# Patient Record
Sex: Female | Born: 1950 | Race: White | Hispanic: No | Marital: Married | Smoking: Never smoker
Health system: Southern US, Community
[De-identification: ages and names within clinical notes are randomized; demographics above are authoritative.]

## PROBLEM LIST (undated history)

## (undated) DIAGNOSIS — Z9889 Other specified postprocedural states: Secondary | ICD-10-CM

## (undated) DIAGNOSIS — I700 Atherosclerosis of aorta: Secondary | ICD-10-CM

## (undated) DIAGNOSIS — K219 Gastro-esophageal reflux disease without esophagitis: Secondary | ICD-10-CM

## (undated) DIAGNOSIS — I4891 Unspecified atrial fibrillation: Secondary | ICD-10-CM

## (undated) DIAGNOSIS — I10 Essential (primary) hypertension: Secondary | ICD-10-CM

## (undated) DIAGNOSIS — G894 Chronic pain syndrome: Secondary | ICD-10-CM

## (undated) DIAGNOSIS — I509 Heart failure, unspecified: Secondary | ICD-10-CM

## (undated) DIAGNOSIS — E119 Type 2 diabetes mellitus without complications: Secondary | ICD-10-CM

## (undated) DIAGNOSIS — C4492 Squamous cell carcinoma of skin, unspecified: Secondary | ICD-10-CM

## (undated) DIAGNOSIS — E039 Hypothyroidism, unspecified: Secondary | ICD-10-CM

## (undated) DIAGNOSIS — C801 Malignant (primary) neoplasm, unspecified: Secondary | ICD-10-CM

## (undated) DIAGNOSIS — M1990 Unspecified osteoarthritis, unspecified site: Secondary | ICD-10-CM

## (undated) DIAGNOSIS — R011 Cardiac murmur, unspecified: Secondary | ICD-10-CM

## (undated) DIAGNOSIS — K851 Biliary acute pancreatitis without necrosis or infection: Secondary | ICD-10-CM

## (undated) DIAGNOSIS — R600 Localized edema: Secondary | ICD-10-CM

## (undated) DIAGNOSIS — R112 Nausea with vomiting, unspecified: Secondary | ICD-10-CM

## (undated) DIAGNOSIS — R0609 Other forms of dyspnea: Secondary | ICD-10-CM

## (undated) DIAGNOSIS — I480 Paroxysmal atrial fibrillation: Secondary | ICD-10-CM

## (undated) HISTORY — PX: TUBAL LIGATION: SHX77

## (undated) HISTORY — PX: GANGLION CYST EXCISION: SHX1691

## (undated) HISTORY — PX: COLONOSCOPY: SHX174

---

## 2005-08-19 ENCOUNTER — Ambulatory Visit: Payer: Self-pay | Admitting: Internal Medicine

## 2010-06-01 ENCOUNTER — Emergency Department: Payer: Self-pay

## 2011-09-20 ENCOUNTER — Ambulatory Visit: Payer: Self-pay | Admitting: Internal Medicine

## 2012-09-24 ENCOUNTER — Ambulatory Visit: Payer: Self-pay | Admitting: Internal Medicine

## 2013-09-30 ENCOUNTER — Ambulatory Visit: Payer: Self-pay | Admitting: Internal Medicine

## 2014-10-01 ENCOUNTER — Ambulatory Visit: Payer: Self-pay | Admitting: Internal Medicine

## 2014-11-10 ENCOUNTER — Ambulatory Visit: Payer: Self-pay | Admitting: Unknown Physician Specialty

## 2015-04-06 LAB — SURGICAL PATHOLOGY

## 2015-09-14 ENCOUNTER — Other Ambulatory Visit: Payer: Self-pay | Admitting: Internal Medicine

## 2015-09-14 DIAGNOSIS — Z1231 Encounter for screening mammogram for malignant neoplasm of breast: Secondary | ICD-10-CM

## 2015-09-21 DIAGNOSIS — I5030 Unspecified diastolic (congestive) heart failure: Secondary | ICD-10-CM

## 2015-09-21 HISTORY — DX: Unspecified diastolic (congestive) heart failure: I50.30

## 2015-10-05 ENCOUNTER — Ambulatory Visit
Admission: RE | Admit: 2015-10-05 | Discharge: 2015-10-05 | Disposition: A | Discharge status: Home / Self Care | Payer: 59 | Source: Ambulatory Visit | Attending: Internal Medicine | Admitting: Internal Medicine

## 2015-10-05 DIAGNOSIS — Z1231 Encounter for screening mammogram for malignant neoplasm of breast: Secondary | ICD-10-CM | POA: Diagnosis present

## 2016-09-06 NOTE — H&P (Signed)
HPI:  Pt presents for a preoperative visit to schedule a D&C, hysteroscopy, and polypectomy.  She has a hx of: PMB with possible polyp on SIS  Workup has included: SIS: polyp or septum - very painful EMBx: No Hyperplasia carcinoma, +atrophic Pap 10/16- wnl, no HPV  Hx HTN on meds Hx of HRT use currently  Past Medical History:  has a past medical history of Arthritis; Heart murmur; Hypertension; Other abnormal glucose; and Unspecified hypothyroidism.  Past Surgical History:  has a past surgical history that includes Tubal ligation; Right wrist surgery for cyst; Colonoscopy (08/19/2005); Colonoscopy (11/10/2014); and egd (11/10/2014). Family History: family history includes COPD in her father; Coronary artery disease in her mother; Dementia in her brother; Heart attack in her brother and mother; Stroke in her brother; Throat cancer in her brother. Social History:  reports that she has never smoked. She has never used smokeless tobacco. She reports that she does not drink alcohol or use illicit drugs. OB/GYN History:  OB History    Gravida Para Term Preterm AB Living   1 1 1   2    SAB TAB Ectopic Multiple Live Births       1      Allergies: is allergic to celebrex [celecoxib]. Medications:  Current Outpatient Prescriptions:  .  acetaminophen (TYLENOL) 500 MG tablet, Take 500 mg by mouth every 6 (six) hours as needed for Pain., Disp: , Rfl:  .  estradiol-norethindrone (ACTIVELLA) 1-0.5 mg tablet, Take 1 tablet by mouth once daily., Disp: 3 Package, Rfl: 3 .  fluticasone (FLONASE) 50 mcg/actuation nasal spray, Place 2 sprays into both nostrils once daily as needed for Rhinitis or Allergies., Disp: 32 g, Rfl: 12 .  levothyroxine (SYNTHROID, LEVOTHROID) 200 MCG tablet, Take 1 tablet (200 mcg total) by mouth once daily. Take on an empty stomach with a glass of water at least 30-60 minutes before breakfast., Disp: 90 tablet, Rfl: 4 .  loratadine (CLARITIN) 10 mg capsule, Take 10  mg by mouth once daily., Disp: , Rfl:  .  losartan-hydrochlorothiazide (HYZAAR) 100-12.5 mg tablet, Take 1 tablet by mouth once daily., Disp: 90 tablet, Rfl: 3 .  nystatin-triamcinolone ointment, Apply topically 2 (two) times daily as needed., Disp: 30 g, Rfl: 11 .  ranitidine (ZANTAC) 150 MG tablet, Take 150 mg by mouth 2 (two) times daily. As needed , Disp: , Rfl:  .  traMADol (ULTRAM) 50 mg tablet, Take 1 tablet (50 mg total) by mouth every 8 (eight) hours as needed for Pain., Disp: 180 tablet, Rfl: 1  Review of Systems: No SOB, no palpitations or chest pain, no new lower extremity edema, no nausea or vomiting or bowel or bladder complaints. See HPI for gyn specific ROS.   Exam:   Vitals:   08/29/16 1625  BP: 128/78  Pulse: 72    WDWN white female in NAD Body mass index is 43.77 kg/(m^2).  General: Patient is well-groomed, well-nourished, appears stated age in no acute distress  HEENT: head is atraumatic and normocephalic, trachea is midline, neck is supple with no palpable nodules  CV: Regular rhythm and normal heart rate, no murmur  Pulm: Clear to auscultation throughout lung fields with no wheezing, crackles, or rhonchi. No increased work of breathing  Abdomen: soft , no mass, non-tender, no rebound tenderness, no hepatomegaly  Pelvic: tanner stage 5 ,                        External genitalia: vulva /  labia no lesions                       Urethra: no prolapse                       Vagina: normal physiologic d/c, laxity in vaginal walls                       Cervix: no lesions, no cervical motion tenderness, good descent                       Uterus: normal size shape and contour, non-tender                       Adnexa: no mass,  non-tender                         Rectovaginal: External wnl  Impression:   The primary encounter diagnosis was PMB (postmenopausal bleeding). A diagnosis of Endometrial thickening on ultra sound was also pertinent to this  visit.    Plan:   -  Preoperative visit: Fractional D&C hysteroscopy. Consents signed today. Risks of surgery were discussed with the patient including but not limited to: bleeding which may require transfusion; infection which may require antibiotics; injury to uterus or surrounding organs; intrauterine scarring which may impair future fertility; need for additional procedures including laparotomy or laparoscopy; and other postoperative/anesthesia complications. Written informed consent was obtained.  This is a scheduled same-day surgery. She will have a postop visit in 2 weeks to review operative findings and pathology.  -  Return in about 4 weeks (around 09/26/2016) for Postop check.  Sherrie George, MD

## 2016-09-07 ENCOUNTER — Encounter
Admission: RE | Admit: 2016-09-07 | Discharge: 2016-09-07 | Disposition: A | Discharge status: Home / Self Care | Payer: 59 | Source: Ambulatory Visit | Attending: Obstetrics and Gynecology | Admitting: Obstetrics and Gynecology

## 2016-09-07 DIAGNOSIS — E039 Hypothyroidism, unspecified: Secondary | ICD-10-CM | POA: Diagnosis not present

## 2016-09-07 DIAGNOSIS — I10 Essential (primary) hypertension: Secondary | ICD-10-CM | POA: Diagnosis not present

## 2016-09-07 DIAGNOSIS — R011 Cardiac murmur, unspecified: Secondary | ICD-10-CM | POA: Diagnosis not present

## 2016-09-07 DIAGNOSIS — R938 Abnormal findings on diagnostic imaging of other specified body structures: Secondary | ICD-10-CM | POA: Diagnosis not present

## 2016-09-07 DIAGNOSIS — Z79899 Other long term (current) drug therapy: Secondary | ICD-10-CM | POA: Diagnosis not present

## 2016-09-07 DIAGNOSIS — Z888 Allergy status to other drugs, medicaments and biological substances status: Secondary | ICD-10-CM | POA: Diagnosis not present

## 2016-09-07 DIAGNOSIS — Z01812 Encounter for preprocedural laboratory examination: Secondary | ICD-10-CM | POA: Diagnosis present

## 2016-09-07 DIAGNOSIS — Z9889 Other specified postprocedural states: Secondary | ICD-10-CM | POA: Diagnosis not present

## 2016-09-07 DIAGNOSIS — M1990 Unspecified osteoarthritis, unspecified site: Secondary | ICD-10-CM | POA: Diagnosis not present

## 2016-09-07 DIAGNOSIS — N950 Postmenopausal bleeding: Secondary | ICD-10-CM | POA: Diagnosis not present

## 2016-09-07 DIAGNOSIS — Z01810 Encounter for preprocedural cardiovascular examination: Secondary | ICD-10-CM | POA: Diagnosis not present

## 2016-09-07 HISTORY — DX: Other specified postprocedural states: R11.2

## 2016-09-07 HISTORY — DX: Nausea with vomiting, unspecified: Z98.890

## 2016-09-07 HISTORY — DX: Essential (primary) hypertension: I10

## 2016-09-07 HISTORY — DX: Gastro-esophageal reflux disease without esophagitis: K21.9

## 2016-09-07 HISTORY — DX: Malignant (primary) neoplasm, unspecified: C80.1

## 2016-09-07 HISTORY — DX: Hypothyroidism, unspecified: E03.9

## 2016-09-07 NOTE — Patient Instructions (Signed)
Your procedure is scheduled on: 09/23/16 Report to Day Surgery. 2nd floor medical mall entrance To find out your arrival time please call [phone] between 1PM - 3PM on 09/22/16.  Remember: Instructions that are not followed completely may result in serious medical risk, up to and including death, or upon the discretion of your surgeon and anesthesiologist your surgery may need to be rescheduled.    __X__ 1. Do not eat food or drink liquids after midnight. No gum chewing or hard candies.     __X__ 2. No Alcohol for 24 hours before or after surgery.   ____ 3. Bring all medications with you on the day of surgery if instructed.    __X__ 4. Notify your doctor if there is any change in your medical condition     (cold, fever, infections).     Do not wear jewelry, make-up, hairpins, clips or nail polish.  Do not wear lotions, powders, or perfumes.   Do not shave 48 hours prior to surgery. Men may shave face and neck.  Do not bring valuables to the hospital.    [facility] is not responsible for any belongings or valuables.               Contacts, dentures or bridgework may not be worn into surgery.  Leave your suitcase in the car. After surgery it may be brought to your room.  For patients admitted to the hospital, discharge time is determined by your                treatment team.   Patients discharged the day of surgery will not be allowed to drive home.   Please read over the following fact sheets that you were given:     __X__ Take these medicines the morning of surgery with A SIP OF WATER:    1. LEVOTHYROXINE  2. LORATADINE  3. RANITIDINE  4.  5.  6.  ____ Fleet Enema (as directed)   ____ Use CHG Soap as directed  ____ Use inhalers on the day of surgery  ____ Stop metformin 2 days prior to surgery    ____ Take 1/2 of usual insulin dose the night before surgery and none on the morning of surgery.   ____ Stop Coumadin/Plavix/aspirin on   ____ Stop  Anti-inflammatories on    ____ Stop supplements until after surgery.    ____ Bring C-Pap to the hospital.

## 2016-09-09 ENCOUNTER — Ambulatory Visit
Admission: RE | Admit: 2016-09-09 | Discharge: 2016-09-09 | Disposition: A | Discharge status: Home / Self Care | Payer: 59 | Source: Ambulatory Visit | Attending: Obstetrics and Gynecology | Admitting: Obstetrics and Gynecology

## 2016-09-09 ENCOUNTER — Other Ambulatory Visit: Payer: Self-pay

## 2016-09-09 DIAGNOSIS — I10 Essential (primary) hypertension: Secondary | ICD-10-CM | POA: Diagnosis not present

## 2016-09-09 DIAGNOSIS — R938 Abnormal findings on diagnostic imaging of other specified body structures: Secondary | ICD-10-CM | POA: Insufficient documentation

## 2016-09-09 DIAGNOSIS — N950 Postmenopausal bleeding: Secondary | ICD-10-CM | POA: Insufficient documentation

## 2016-09-09 DIAGNOSIS — Z888 Allergy status to other drugs, medicaments and biological substances status: Secondary | ICD-10-CM | POA: Insufficient documentation

## 2016-09-09 DIAGNOSIS — Z01810 Encounter for preprocedural cardiovascular examination: Secondary | ICD-10-CM | POA: Insufficient documentation

## 2016-09-09 DIAGNOSIS — Z01812 Encounter for preprocedural laboratory examination: Secondary | ICD-10-CM | POA: Insufficient documentation

## 2016-09-09 DIAGNOSIS — R011 Cardiac murmur, unspecified: Secondary | ICD-10-CM | POA: Insufficient documentation

## 2016-09-09 DIAGNOSIS — Z9889 Other specified postprocedural states: Secondary | ICD-10-CM | POA: Insufficient documentation

## 2016-09-09 DIAGNOSIS — Z79899 Other long term (current) drug therapy: Secondary | ICD-10-CM | POA: Insufficient documentation

## 2016-09-09 DIAGNOSIS — E039 Hypothyroidism, unspecified: Secondary | ICD-10-CM | POA: Insufficient documentation

## 2016-09-09 DIAGNOSIS — M1990 Unspecified osteoarthritis, unspecified site: Secondary | ICD-10-CM | POA: Insufficient documentation

## 2016-09-09 LAB — BASIC METABOLIC PANEL
ANION GAP: 3 — AB (ref 5–15)
BUN: 17 mg/dL (ref 6–20)
CALCIUM: 8.7 mg/dL — AB (ref 8.9–10.3)
CHLORIDE: 106 mmol/L (ref 101–111)
CO2: 29 mmol/L (ref 22–32)
Creatinine, Ser: 0.89 mg/dL (ref 0.44–1.00)
GFR calc non Af Amer: >60 mL/min (ref >60)
Glucose, Bld: 120 mg/dL — ABNORMAL HIGH (ref 65–99)
Potassium: 4 mmol/L (ref 3.5–5.1)
Sodium: 138 mmol/L (ref 135–145)

## 2016-09-09 LAB — CBC
HCT: 38.2 % (ref 35.0–47.0)
HEMOGLOBIN: 13.3 g/dL (ref 12.0–16.0)
MCH: 31 pg (ref 26.0–34.0)
MCHC: 34.9 g/dL (ref 32.0–36.0)
MCV: 88.8 fL (ref 80.0–100.0)
Platelets: 222 10*3/uL (ref 150–440)
RBC: 4.3 MIL/uL (ref 3.80–5.20)
RDW: 14.6 % — ABNORMAL HIGH (ref 11.5–14.5)
WBC: 8.4 10*3/uL (ref 3.6–11.0)

## 2016-09-09 LAB — TYPE AND SCREEN
ABO/RH(D): O POS
Antibody Screen: NEGATIVE

## 2016-09-13 NOTE — Pre-Procedure Instructions (Signed)
NM myocardial perfusion SPECT multiple (stress and rest)10/06/2015 [place] Result Impression   1. Negative ETT 2. Normal left ventricular function 3. Normal wall motion 4. No evidence for scar or ischemia  Result Narrative  Procedure: Exercise Myocardial Perfusion Imaging ONE day procedure  Indication: Abnormal echocardiogram - Plan: NM myocardial perfusion SPECT  multiple (stress and rest), ECG stress test only Ordering Physician: Ramonita Lab  INTERPRETING:  Dr. Isaias Cowman   Clinical History: 65 y.o. year old female Vitals: Height: 19 inWeight: 248 lb Cardiac risk factors include:  MURMUR, HTN, Family Hx CAD and Obesity    Procedure: The patient performed treadmill exercise using a Bruce protocol for 7:00  minutes. The exercise test was stopped due to SOB/fatigue.Blood pressure  response was normal.   Rest HR: 67bpm Rest BP: 140/25mmHg Max HR: 155bpm Max BP: 192/19mmHg Mets: 9.90 % MAX HR: 97%  Stress Test Administered by: Oswald Hillock, CMA  ECG Interpretation: Rest [id number] sinus rhythm, none Stress [id number] sinus rhythm,  Recovery [id number] sinus rhythm ECG Interpretation:negative, no ECG changes.   Administrations This Visit  technetium Tc18m sestamibi (CARDIOLITE) injection 13 millicurie  Admin Date Action Dose Route Administered By      [id number] Given [id number] millicurie Intravenous Ane Payment, CNMT      technetium Tc96m sestamibi (CARDIOLITE) injection 123456 millicurie  Admin Date Action Dose Route Administered By      [id number] Given 123456 millicurie Intravenous Kingsley Callander, CNMT        Gated post-stress perfusion imaging was performed 30 minutes after stress.  Rest images were performed 30 minutes after injection.  Gated LV Analysis:  TID:  LVEF= 63%  FINDINGS: Regional wall motion:reveals normal myocardial thickening and wall  motion. The  overall quality of the study is good. Artifacts noted: no Left ventricular cavity: normal.  Perfusion Analysis:SPECT images demonstrate homogeneous tracer  distribution throughout the myocardium.  Status Results Details    Appointment on 10/06/2015 [place]")' [email or web address]/">Encounter Summary

## 2016-09-13 NOTE — Pre-Procedure Instructions (Signed)
Echocardiogram 2D complete10/09/2015 [place] Component Name Value Ref Range  LV Ejection Fraction (%) 50   Aortic Valve Stenosis Grade none   Aortic Valve Regurgitation Grade trivial   Mitral Valve Stenosis Grade none   Mitral Valve Regurgitation Grade trivial   Tricuspid Valve Regurgitation Grade mild   Tricuspid Valve Regurgitation Max Velocity (m/s) 2.5 m/sec   LV End Diastolic Diameter (cm) 4.7 cm  LV End Systolic Diameter (cm) 3.4 cm  LV Septum Wall Thickness (cm) 1.1 cm  LV Posterior Wall Thickness (cm) 1.0 cm  Left Atrium Diameter (cm) 4.3 cm  Result Narrative  INTERNAL MEDICINE DEPARTMENT Amanda Holmes, Amanda Holmes [place] [facility] Acct #: [id number] [place], Wilmot [id number]: 09/21/2015 08:18 AM  Adult Female Age: 65 yrs ECHOCARDIOGRAM REPORTOutpatient  STUDY:CHEST WALL TAPE:0000:00: 0:00:00KC::KCWI ECHO:Yes DOPPLER:YesFILE:0000-000-000MD1:KLEIN, BERT JACK  COLOR:YesCONTRAST:No MACHINE:PhilipsHeight: 19 in  RV BIOPSY:No 3D:NoSOUND QLTY:Moderate Weight: 248 lb MEDIUM:None  BSA: 2.1 m2 ___________________________________________________________________________________________  HISTORY:DOE REASON:Assess, LV function INDICATION:R01.1 MURMUR  ___________________________________________________________________________________________ ECHOCARDIOGRAPHIC MEASUREMENTS 2D DIMENSIONS AORTA ValuesNormal  RangeMAIN PAValuesNormal Range Annulus:nm* [2.1 - 2.5]PA Main:nm* [1.5 - 2.1] Aorta Sin:nm* [2.7 - 3.3] RIGHT VENTRICLE ST Junction:nm* [2.3 - 2.9]RV Base:nm* [ < 4.2] Asc.Aorta:nm* [2.3 - 3.1] RV Mid:nm* [ < 3.5]  LEFT VENTRICLERV Length:nm* [ < 8.6] LVIDd:4.7 cm[3.9 - 5.3] INFERIOR VENA CAVA LVIDs:3.4 cmMax. IVC:nm* [ <= 2.1]  FS:27.7 %[> 25]Min. IVC:nm* SWT:1.1 cm[0.5 - 0.9] ------------------ PWT:1.0 cm[0.5 - 0.9] nm* - not measured  LEFT ATRIUM Amanda Diam:4.3 cm[2.7 - 3.8] Amanda A4C Area:nm* [ < 20] Amanda Volume:nm* [22 - 52]  ___________________________________________________________________________________________ ECHOCARDIOGRAPHIC DESCRIPTIONS  AORTIC ROOT Size:Normal Dissection:INDETERM FOR DISSECTION  AORTIC VALVE Leaflets:TricuspidMorphology:Normal Mobility:Fully mobile  LEFT VENTRICLE Size:Normal Anterior:Normal  Contraction:REGIONALLY IMPAIRED Lateral:Normal Closest EF:50% (Estimated)Septal:HYPOCONTRACTILE  LV Masses:No MassesApical:Normal  [id number] Inferior:Normal  Posterior:Normal Dias.FxClass:(Grade 1) relaxation abnormal, E/A reversal  MITRAL VALVE Leaflets:Normal Mobility:Fully mobile Morphology:Normal  LEFT ATRIUM Size:MILDLY ENLARGED Amanda Masses:No masses  IA Septum:Normal IAS  MAIN PA Size:Normal  PULMONIC  VALVE Morphology:Normal Mobility:Fully mobile  RIGHT VENTRICLE  RV Masses:No MassesSize:Normal  Free Wall:NormalContraction:Normal  TRICUSPID VALVE Leaflets:Normal Mobility:Fully mobile Morphology:Normal  RIGHT ATRIUM Size:Normal RA Other:None  RA Mass:No masses  PERICARDIUM  Fluid:No effusion  INFERIOR VENACAVA Size:DILATED Normal respiratory collapse   _____________________________________________________________________ DOPPLER ECHO and OTHER SPECIAL PROCEDURES  Aortic:TRIVIAL AR No AS   Mitral:TRIVIAL MR No MS MV Inflow E Vel=120.0 cm/sec MV Annulus E'Vel=8.2 cm/sec E/E'Ratio=14.7  Tricuspid:MILD TRNo TS 252.0 cm/sec peak TR vel  Pulmonary:TRIVIAL PR No PS     ___________________________________________________________________________________________ INTERPRETATION MILD LV SYSTOLIC DYSFUNCTION (See above) NORMAL RIGHT VENTRICULAR SYSTOLIC FUNCTION MILD VALVULAR REGURGITATION (See above) NO VALVULAR STENOSIS Focal septal wall hypokinesis   ___________________________________________________________________________________________ Electronically signed by: Rusty Aus, MD on 09/21/2015 01:24 PM Performed By: Scherrie November, RCS Ordering Physician: Sheliah Hatch  ___________________________________________________________________________________________  Status Results Details

## 2016-09-14 ENCOUNTER — Other Ambulatory Visit: Payer: Self-pay | Admitting: Internal Medicine

## 2016-09-14 DIAGNOSIS — Z1231 Encounter for screening mammogram for malignant neoplasm of breast: Secondary | ICD-10-CM

## 2016-09-23 ENCOUNTER — Encounter
Admission: RE | Disposition: A | Discharge status: Home / Self Care | Payer: Self-pay | Source: Ambulatory Visit | Attending: Obstetrics and Gynecology

## 2016-09-23 ENCOUNTER — Ambulatory Visit: Payer: 59 | Admitting: Anesthesiology

## 2016-09-23 ENCOUNTER — Encounter: Payer: Self-pay | Admitting: *Deleted

## 2016-09-23 ENCOUNTER — Ambulatory Visit
Admission: RE | Admit: 2016-09-23 | Discharge: 2016-09-23 | Disposition: A | Discharge status: Home / Self Care | Payer: 59 | Source: Ambulatory Visit | Attending: Obstetrics and Gynecology | Admitting: Obstetrics and Gynecology

## 2016-09-23 DIAGNOSIS — N950 Postmenopausal bleeding: Secondary | ICD-10-CM | POA: Diagnosis present

## 2016-09-23 DIAGNOSIS — N858 Other specified noninflammatory disorders of uterus: Secondary | ICD-10-CM | POA: Insufficient documentation

## 2016-09-23 DIAGNOSIS — E039 Hypothyroidism, unspecified: Secondary | ICD-10-CM | POA: Diagnosis not present

## 2016-09-23 DIAGNOSIS — K219 Gastro-esophageal reflux disease without esophagitis: Secondary | ICD-10-CM | POA: Insufficient documentation

## 2016-09-23 DIAGNOSIS — N840 Polyp of corpus uteri: Secondary | ICD-10-CM | POA: Insufficient documentation

## 2016-09-23 DIAGNOSIS — Z419 Encounter for procedure for purposes other than remedying health state, unspecified: Secondary | ICD-10-CM

## 2016-09-23 HISTORY — PX: DILATION AND CURETTAGE OF UTERUS: SHX78

## 2016-09-23 LAB — ABO/RH: ABO/RH(D): O POS

## 2016-09-23 SURGERY — EXAM UNDER ANESTHESIA
Anesthesia: General

## 2016-09-23 MED ORDER — NEOSTIGMINE METHYLSULFATE 10 MG/10ML IV SOLN
INTRAVENOUS | Status: DC | PRN
  Administered 2016-09-23: 4 mg via INTRAVENOUS

## 2016-09-23 MED ORDER — OXYCODONE HCL 5 MG/5ML PO SOLN: 5.0000 mg | Freq: Once | ORAL | Status: DC | PRN

## 2016-09-23 MED ORDER — FENTANYL CITRATE (PF) 100 MCG/2ML IJ SOLN: 25.0000 ug | INTRAMUSCULAR | Status: DC | PRN

## 2016-09-23 MED ORDER — OXYCODONE HCL 5 MG PO TABS: 5.0000 mg | ORAL_TABLET | Freq: Once | ORAL | Status: DC | PRN

## 2016-09-23 MED ORDER — GLYCOPYRROLATE 0.2 MG/ML IJ SOLN
INTRAMUSCULAR | Status: DC | PRN
  Administered 2016-09-23: 0.6 mg via INTRAVENOUS

## 2016-09-23 MED ORDER — OXYCODONE-ACETAMINOPHEN 5-325 MG PO TABS: 1.0000 | ORAL_TABLET | Freq: Four times a day (QID) | ORAL | 0 refills | Status: DC | PRN

## 2016-09-23 MED ORDER — PROMETHAZINE HCL 25 MG/ML IJ SOLN
INTRAMUSCULAR | Status: AC
  Administered 2016-09-23: 6.25 mg via INTRAVENOUS
  Filled 2016-09-23: qty 1

## 2016-09-23 MED ORDER — PROPOFOL 10 MG/ML IV BOLUS
INTRAVENOUS | Status: DC | PRN
  Administered 2016-09-23: 150 mg via INTRAVENOUS

## 2016-09-23 MED ORDER — LIDOCAINE HCL (CARDIAC) 20 MG/ML IV SOLN
INTRAVENOUS | Status: DC | PRN
  Administered 2016-09-23: 60 mg via INTRAVENOUS

## 2016-09-23 MED ORDER — SODIUM CHLORIDE 0.9 % IJ SOLN
INTRAMUSCULAR | Status: AC
  Filled 2016-09-23: qty 10

## 2016-09-23 MED ORDER — DEXAMETHASONE SODIUM PHOSPHATE 10 MG/ML IJ SOLN
INTRAMUSCULAR | Status: DC | PRN
  Administered 2016-09-23: 10 mg via INTRAVENOUS

## 2016-09-23 MED ORDER — FENTANYL CITRATE (PF) 100 MCG/2ML IJ SOLN
INTRAMUSCULAR | Status: DC | PRN
  Administered 2016-09-23: 100 ug via INTRAVENOUS

## 2016-09-23 MED ORDER — MIDAZOLAM HCL 2 MG/2ML IJ SOLN
INTRAMUSCULAR | Status: DC | PRN
  Administered 2016-09-23: 2 mg via INTRAVENOUS

## 2016-09-23 MED ORDER — ONDANSETRON 4 MG PO TBDP: 4.0000 mg | ORAL_TABLET | Freq: Three times a day (TID) | ORAL | 0 refills | Status: DC | PRN

## 2016-09-23 MED ORDER — PROMETHAZINE HCL 25 MG/ML IJ SOLN
6.2500 mg | Freq: Once | INTRAMUSCULAR | Status: AC
  Administered 2016-09-23: 6.25 mg via INTRAVENOUS

## 2016-09-23 MED ORDER — SCOPOLAMINE 1 MG/3DAYS TD PT72
MEDICATED_PATCH | TRANSDERMAL | Status: AC
  Administered 2016-09-23: 1.5 mg via TRANSDERMAL
  Filled 2016-09-23: qty 1

## 2016-09-23 MED ORDER — DOCUSATE SODIUM 100 MG PO CAPS: 100.0000 mg | ORAL_CAPSULE | Freq: Every day | ORAL | 3 refills | Status: DC | PRN

## 2016-09-23 MED ORDER — ROCURONIUM BROMIDE 100 MG/10ML IV SOLN
INTRAVENOUS | Status: DC | PRN
  Administered 2016-09-23: 20 mg via INTRAVENOUS

## 2016-09-23 MED ORDER — SILVER NITRATE-POT NITRATE 75-25 % EX MISC
CUTANEOUS | Status: AC
  Filled 2016-09-23: qty 4

## 2016-09-23 MED ORDER — SCOPOLAMINE 1 MG/3DAYS TD PT72
1.0000 | MEDICATED_PATCH | TRANSDERMAL | Status: DC
  Administered 2016-09-23: 1.5 mg via TRANSDERMAL

## 2016-09-23 MED ORDER — LACTATED RINGERS IV SOLN: INTRAVENOUS | Status: DC

## 2016-09-23 MED ORDER — SUCCINYLCHOLINE CHLORIDE 20 MG/ML IJ SOLN
INTRAMUSCULAR | Status: DC | PRN
  Administered 2016-09-23: 120 mg via INTRAVENOUS

## 2016-09-23 MED ORDER — LACTATED RINGERS IV SOLN
INTRAVENOUS | Status: DC
  Administered 2016-09-23: 07:00:00 via INTRAVENOUS

## 2016-09-23 MED ORDER — ONDANSETRON HCL 4 MG/2ML IJ SOLN
INTRAMUSCULAR | Status: DC | PRN
  Administered 2016-09-23: 4 mg via INTRAVENOUS

## 2016-09-23 SURGICAL SUPPLY — 17 items
CATH ROBINSON RED A/P 16FR (CATHETERS) ×2 IMPLANT
CUP MEDICINE 2OZ PLAST GRAD ST (MISCELLANEOUS) ×2 IMPLANT
DRAPE UNDER BUTTOCK W/FLU (DRAPES) ×2 IMPLANT
GLOVE BIO SURGEON STRL SZ 6.5 (GLOVE) ×2 IMPLANT
GOWN STRL REUS W/ TWL LRG LVL3 (GOWN DISPOSABLE) ×2 IMPLANT
GOWN STRL REUS W/TWL LRG LVL3 (GOWN DISPOSABLE) ×2
KIT RM TURNOVER CYSTO AR (KITS) ×2 IMPLANT
LABEL OR SOLS (LABEL) ×2 IMPLANT
NS IRRIG 500ML POUR BTL (IV SOLUTION) ×2 IMPLANT
PACK DNC HYST (MISCELLANEOUS) ×2 IMPLANT
PAD OB MATERNITY 4.3X12.25 (PERSONAL CARE ITEMS) ×2 IMPLANT
PAD PREP 24X41 OB/GYN DISP (PERSONAL CARE ITEMS) ×2 IMPLANT
SOL PREP PVP 2OZ (MISCELLANEOUS) ×2
SOLUTION PREP PVP 2OZ (MISCELLANEOUS) ×1 IMPLANT
SPONGE XRAY 4X4 16PLY STRL (MISCELLANEOUS) ×2 IMPLANT
SURGILUBE 2OZ TUBE FLIPTOP (MISCELLANEOUS) ×2 IMPLANT
TOWEL OR 17X26 4PK STRL BLUE (TOWEL DISPOSABLE) ×2 IMPLANT

## 2016-09-23 NOTE — Anesthesia Preprocedure Evaluation (Signed)
Anesthesia Evaluation  Patient identified by MRN, date of birth, ID band Patient awake    Reviewed: Allergy & Precautions, H&P , NPO status , Patient's Chart, lab work & pertinent test results  History of Anesthesia Complications (+) PONV and history of anesthetic complications  Airway Mallampati: III  TM Distance: >3 FB Neck ROM: full    Dental  (+) Poor Dentition, Chipped, Missing   Pulmonary neg pulmonary ROS, neg shortness of breath,    Pulmonary exam normal breath sounds clear to auscultation       Cardiovascular Exercise Tolerance: Good hypertension, (-) angina(-) Past MI and (-) DOE Normal cardiovascular exam Rhythm:regular Rate:Normal     Neuro/Psych negative neurological ROS  negative psych ROS   GI/Hepatic Neg liver ROS, GERD  Controlled,  Endo/Other  Hypothyroidism   Renal/GU      Musculoskeletal   Abdominal   Peds  Hematology negative hematology ROS (+)   Anesthesia Other Findings Past Medical History: No date: Cancer ([place])     Comment: skin ca squamous cell No date: GERD (gastroesophageal reflux disease) No date: Hypertension No date: Hypothyroidism No date: PONV (postoperative nausea and vomiting)  Past Surgical History: No date: GANGLION CYST EXCISION No date: TUBAL LIGATION     Reproductive/Obstetrics negative OB ROS                             Anesthesia Physical Anesthesia Plan  ASA: III  Anesthesia Plan: General LMA   Post-op Pain Management:    Induction:   Airway Management Planned:   Additional Equipment:   Intra-op Plan:   Post-operative Plan:   Informed Consent: I have reviewed the patients History and Physical, chart, labs and discussed the procedure including the risks, benefits and alternatives for the proposed anesthesia with the patient or authorized representative who has indicated his/her understanding and acceptance.   Dental  Advisory Given  Plan Discussed with: Anesthesiologist, CRNA and Surgeon  Anesthesia Plan Comments:         Anesthesia Quick Evaluation

## 2016-09-23 NOTE — Progress Notes (Signed)
Nauseated  Phenergan 6.25mg  given

## 2016-09-23 NOTE — Anesthesia Postprocedure Evaluation (Signed)
Anesthesia Post Note  Patient: Amanda Holmes  Procedure(s) Performed: Procedure(s) (LRB): EXAM UNDER ANESTHESIA (N/A) DILATATION AND CURETTAGE AND HYSTEROSCOPY (N/A)  Patient location during evaluation: PACU Anesthesia Type: General Level of consciousness: awake and alert Pain management: pain level controlled Vital Signs Assessment: post-procedure vital signs reviewed and stable Respiratory status: spontaneous breathing, nonlabored ventilation, respiratory function stable and patient connected to nasal cannula oxygen Cardiovascular status: blood pressure returned to baseline and stable Postop Assessment: no signs of nausea or vomiting Anesthetic complications: no    Last Vitals:  Vitals:   09/23/16 0949 09/23/16 1049  BP: (!) 146/62 (!) 152/71  Pulse: 69 74  Resp: 14 16  Temp: 36.8 C 36.8 C    Last Pain:  Vitals:   09/23/16 1049  TempSrc:   PainSc: 1                  Precious Haws [place]it[place]

## 2016-09-23 NOTE — Op Note (Signed)
Operative Report Hysteroscopy with Dilation and Curettage   Indications: Postmenopausal bleeding   Pre-operative Diagnosis: Endometrial polyp   Post-operative Diagnosis: same.  Procedure: 1. Exam under anesthesia 2. Fractional D&C 3. Hysteroscopy 4. Polypectomy  Surgeon: Benjaman Kindler, MD  Assistant(s):  None  Anesthesia: General endotracheal anesthesia  Anesthesiologist: Andria Frames, MD Anesthesiologist: Andria Frames, MD CRNA: Letitia Neri, CRNA  Estimated Blood Loss:  Minimal         Total IV Fluids: 654ml  Urine Output: 73ml  Total Fluid Deficit:  n/a         Specimens: Endocervical curettings, endometrial curettings with endometrial polyp         Complications:  None; patient tolerated the procedure well.         Disposition: PACU - hemodynamically stable.         Condition: stable  Findings: Uterus measuring 8 cm by sound; normal cervix, vagina, perineum. Atrophic endometrial tissue with posterior polyps in a single site  Indication for procedure/Consents: 65 y.o.  here for scheduled surgery for the aforementioned diagnoses.  Risks of surgery were discussed with the patient including but not limited to: bleeding which may require transfusion; infection which may require antibiotics; injury to uterus or surrounding organs; intrauterine scarring which may impair future fertility; need for additional procedures including laparotomy or laparoscopy; and other postoperative/anesthesia complications. Written informed consent was obtained.    Procedure Details:   D&C/Polypectomy The patient was taken to the operating room where anesthesia was administered and was found to be adequate. After a formal and adequate timeout was performed, she was placed in the dorsal lithotomy position and examined with the above findings. She was then prepped and draped in the sterile manner. Her bladder was catheterized for an estimated amount of clear, yellow urine.  A weighed speculum was then placed in the patient's vagina and a single tooth tenaculum was applied to the anterior lip of the cervix.  Her cervix was serially dilated to 15 [place] using Hanks dilators. An ECC was performed. Her uterus was sounded to 8 cm. The hysteroscope was introduced to reveal the above findings. Endometrial polypectomy was performed with polypectomy forceps.   A sharp curettage was then performed until there was a gritty texture in all four quadrants. The tenaculum was removed from the anterior lip of the cervix and the vaginal speculum was removed after applying silver nitrate for good hemostasis.   The patient tolerated the procedure well and was taken to the recovery area awake and in stable condition. She received iv acetaminophen and Toradol prior to leaving the OR.  The patient will be discharged to home as per PACU criteria. Routine postoperative instructions given. She was prescribed Ibuprofen and Colace. She will follow up in the clinic in two weeks for postoperative evaluation.

## 2016-09-23 NOTE — Transfer of Care (Signed)
Immediate Anesthesia Transfer of Care Note  Patient: Amanda Holmes  Procedure(s) Performed: Procedure(s): EXAM UNDER ANESTHESIA (N/A) DILATATION AND CURETTAGE AND HYSTEROSCOPY (N/A)  Patient Location: PACU  Anesthesia Type:General  Level of Consciousness: sedated  Airway & Oxygen Therapy: Patient Spontanous Breathing and Patient connected to face mask oxygen  Post-op Assessment: Report given to RN and Post -op Vital signs reviewed and stable  Post vital signs: Reviewed and stable  Last Vitals:  Vitals:   09/23/16 0640 09/23/16 0830  BP: (!) 123/46 (!) 158/78  Pulse: 64 86  Resp: 20 17  Temp: 36.6 C 36.6 C    Last Pain:  Vitals:   09/23/16 0640  TempSrc: Oral         Complications: No apparent anesthesia complications

## 2016-09-23 NOTE — Anesthesia Procedure Notes (Signed)
Procedure Name: Intubation Date/Time: 09/23/2016 7:49 AM Performed by: Letitia Neri Pre-anesthesia Checklist: Patient identified, Emergency Drugs available, Suction available, Patient being monitored and Timeout performed Patient Re-evaluated:Patient Re-evaluated prior to inductionOxygen Delivery Method: Circle system utilized Preoxygenation: Pre-oxygenation with 100% oxygen Intubation Type: IV induction Ventilation: Mask ventilation without difficulty Laryngoscope Size: Mac and 3 Grade View: Grade I Tube type: Oral Number of attempts: 1 Placement Confirmation: ETT inserted through vocal cords under direct vision,  positive ETCO2 and breath sounds checked- equal and bilateral Secured at: 21 cm Tube secured with: Tape Dental Injury: Teeth and Oropharynx as per pre-operative assessment

## 2016-09-23 NOTE — Discharge Instructions (Addendum)
Discharge instructions after a hysteroscopy with dilation and curettage  Signs and Symptoms to Report  Call our office at [phone] if you have any of the following:    Fever over 100.4 degrees or higher  Severe stomach pain not relieved with pain medications  Bright red bleeding thats heavier than a period that does not slow with rest after the first 24 hours  To go the bathroom a lot (frequency), you cant hold your urine (urgency), or it hurts when you empty your bladder (urinate)  Chest pain  Shortness of breath  Pain in the calves of your legs  Severe nausea and vomiting not relieved with anti-nausea medications  Any concerns  What You Can Expect after Surgery  You may see some pink tinged, bloody fluid. This is normal. You may also have cramping for several days.   Activities after Your Discharge Follow these guidelines to help speed your recovery at home:  Dont drive if you are in pain or taking narcotic pain medicine. You may drive when you can safely slam on the brakes, turn the wheel forcefully, and rotate your torso comfortably. This is typically 4-7 days. Practice in a parking lot or side street prior to attempting to drive regularly.   Ask others to help with household chores for 4 weeks.  Dont do strenuous activities, exercises, or sports like vacuuming, tennis, squash, etc. until your doctor says it is safe to do so.  Walk as you feel able. Rest often since it may take a week or two for your energy level to return to normal.   You may climb stairs  Avoid constipation:   -Eat fruits, vegetables, and whole grains. Eat small meals as your appetite will take time to return to normal.   -Drink 6 to 8 glasses of water each day unless your doctor has told you to limit your fluids.   -Use a laxative or stool softener as needed if constipation becomes a problem. You may take Miralax, metamucil, Citrucil, Colace, Senekot, FiberCon, etc. If this does not  relieve the constipation, try two tablespoons of Milk Of Magnesia every 8 hours until your bowels move.   You may shower.   Do not get in a hot tub, swimming pool, etc. until your doctor agrees.  Do not douche, use tampons, or have sex until your doctor says it is okay, usually about 2 weeks.  Take your pain medicine when you need it. The medicine may not work as well if the pain is bad.  Take the medicines you were taking before surgery. Other medications you might need are pain medications (ibuprofen or what works for you for cramping), medications for constipation (Colace) and nausea medications (Zofran).     AMBULATORY SURGERY  DISCHARGE INSTRUCTIONS   1) The drugs that you were given will stay in your system until tomorrow so for the next 24 hours you should not:  A) Drive an automobile B) Make any legal decisions C) Drink any alcoholic beverage   2) You may resume regular meals tomorrow.  Today it is better to start with liquids and gradually work up to solid foods.  You may eat anything you prefer, but it is better to start with liquids, then soup and crackers, and gradually work up to solid foods.   3) Please notify your doctor immediately if you have any unusual bleeding, trouble breathing, redness and pain at the surgery site, drainage, fever, or pain not relieved by medication.    4)  Additional Instructions: ° ° ° ° ° ° ° °Please contact your physician with any problems or Same Day Surgery at [phone], Monday through Friday 6 am to 4 pm, or [facility] [facility] number at [phone]. ° ° ° ° °

## 2016-09-23 NOTE — Interval H&P Note (Signed)
History and Physical Interval Note:  09/23/2016 7:33 AM  Amanda Holmes  has presented today for surgery, with the diagnosis of PMB  The various methods of treatment have been discussed with the patient and family. After consideration of risks, benefits and other options for treatment, the patient has consented to  Procedure(s): EXAM UNDER ANESTHESIA (N/A) DILATATION AND CURETTAGE (N/A) and hysteroscopy as a surgical intervention .  The patient's history has been reviewed, patient examined, no change in status, stable for surgery.  I have reviewed the patient's chart and labs.  Questions were answered to the patient's satisfaction.     Benjaman Kindler

## 2016-09-23 NOTE — Anesthesia Procedure Notes (Deleted)
Performed by: Letitia Neri

## 2016-09-26 LAB — SURGICAL PATHOLOGY

## 2016-10-17 ENCOUNTER — Ambulatory Visit
Admission: RE | Admit: 2016-10-17 | Discharge: 2016-10-17 | Disposition: A | Discharge status: Home / Self Care | Payer: 59 | Source: Ambulatory Visit | Attending: Internal Medicine | Admitting: Internal Medicine

## 2016-10-17 DIAGNOSIS — Z1231 Encounter for screening mammogram for malignant neoplasm of breast: Secondary | ICD-10-CM | POA: Insufficient documentation

## 2017-08-09 ENCOUNTER — Encounter
Admission: RE | Admit: 2017-08-09 | Discharge: 2017-08-09 | Disposition: A | Discharge status: Home / Self Care | Payer: Managed Care, Other (non HMO) | Source: Ambulatory Visit | Attending: Orthopedic Surgery | Admitting: Orthopedic Surgery

## 2017-08-09 DIAGNOSIS — I10 Essential (primary) hypertension: Secondary | ICD-10-CM | POA: Insufficient documentation

## 2017-08-09 DIAGNOSIS — Z01810 Encounter for preprocedural cardiovascular examination: Secondary | ICD-10-CM | POA: Diagnosis present

## 2017-08-09 DIAGNOSIS — Z01812 Encounter for preprocedural laboratory examination: Secondary | ICD-10-CM | POA: Diagnosis present

## 2017-08-09 LAB — URINALYSIS, ROUTINE W REFLEX MICROSCOPIC
BILIRUBIN URINE: NEGATIVE
Glucose, UA: NEGATIVE mg/dL
HGB URINE DIPSTICK: NEGATIVE
Ketones, ur: NEGATIVE mg/dL
Leukocytes, UA: NEGATIVE
Nitrite: NEGATIVE
PH: 5 (ref 5.0–8.0)
Protein, ur: NEGATIVE mg/dL
SPECIFIC GRAVITY, URINE: 1.019 (ref 1.005–1.030)

## 2017-08-09 LAB — COMPREHENSIVE METABOLIC PANEL
ALBUMIN: 4.1 g/dL (ref 3.5–5.0)
ALK PHOS: 58 U/L (ref 38–126)
ALT: 20 U/L (ref 14–54)
ANION GAP: 7 (ref 5–15)
AST: 23 U/L (ref 15–41)
BILIRUBIN TOTAL: 0.7 mg/dL (ref 0.3–1.2)
BUN: 17 mg/dL (ref 6–20)
CALCIUM: 9 mg/dL (ref 8.9–10.3)
CO2: 29 mmol/L (ref 22–32)
Chloride: 104 mmol/L (ref 101–111)
Creatinine, Ser: 0.83 mg/dL (ref 0.44–1.00)
Glucose, Bld: 111 mg/dL — ABNORMAL HIGH (ref 65–99)
POTASSIUM: 3.1 mmol/L — AB (ref 3.5–5.1)
Sodium: 140 mmol/L (ref 135–145)
TOTAL PROTEIN: 7.2 g/dL (ref 6.5–8.1)

## 2017-08-09 LAB — SURGICAL PCR SCREEN
MRSA, PCR: NEGATIVE
Staphylococcus aureus: NEGATIVE

## 2017-08-09 LAB — C-REACTIVE PROTEIN: CRP: 0.9 mg/dL (ref <1.0)

## 2017-08-09 LAB — HEMOGLOBIN A1C
Hgb A1c MFr Bld: 5.9 % — ABNORMAL HIGH (ref 4.8–5.6)
MEAN PLASMA GLUCOSE: 122.63 mg/dL

## 2017-08-09 LAB — CBC
HEMATOCRIT: 39.2 % (ref 35.0–47.0)
HEMOGLOBIN: 13.1 g/dL (ref 12.0–16.0)
MCH: 30.8 pg (ref 26.0–34.0)
MCHC: 33.5 g/dL (ref 32.0–36.0)
MCV: 92.1 fL (ref 80.0–100.0)
Platelets: 243 10*3/uL (ref 150–440)
RBC: 4.25 MIL/uL (ref 3.80–5.20)
RDW: 14.7 % — AB (ref 11.5–14.5)
WBC: 7.8 10*3/uL (ref 3.6–11.0)

## 2017-08-09 LAB — PROTIME-INR
INR: 1
PROTHROMBIN TIME: 13.1 s (ref 11.4–15.2)

## 2017-08-09 LAB — TYPE AND SCREEN
ABO/RH(D): O POS
ANTIBODY SCREEN: NEGATIVE

## 2017-08-09 LAB — APTT: APTT: 29 s (ref 24–36)

## 2017-08-09 LAB — SEDIMENTATION RATE: Sed Rate: 28 mm/hr (ref 0–30)

## 2017-08-09 NOTE — Patient Instructions (Signed)
Your procedure is scheduled on: Wednesday 08/23/17 Report to [place]. 2ND FLOOR MEDICAL MALL ENTRANCE. To find out your arrival time please call [phone] between 1PM - 3PM on Tuesday 08/22/17.  Remember: Instructions that are not followed completely may result in serious medical risk, up to and including death, or upon the discretion of your surgeon and anesthesiologist your surgery may need to be rescheduled.    __X__ 1. Do not eat anything after midnight the night before your    procedure.  No gum chewing or hard candies.  You may drink clear   liquids up to 2 hours before you are scheduled to arrive at the   hospital for your procedure. Do not drink clear liquids within 2   hours of scheduled arrival to the hospital as this may lead to your   procedure being delayed or rescheduled.       Clear liquids include:   Water or Apple juice without pulp   Clear carbohydrate beverage such as Clearfast or Gatorade   Black coffee or Clear Tea 9no milk, no creamer, do not add anything   to the coffee or tea)    Diabetics should only drink water   __X__ 2. No Alcohol for 24 hours before or after surgery.   ____ 3. Bring all medications with you on the day of surgery if instructed.    __X__ 4. Notify your doctor if there is any change in your medical condition     (cold, fever, infections).             ___X__5. No smoking within 24 hours of your surgery.     Do not wear jewelry, make-up, hairpins, clips or nail polish.  Do not wear lotions, powders, or perfumes.   Do not shave 48 hours prior to surgery. Men may shave face and neck.  Do not bring valuables to the hospital.    [facility] is not responsible for any belongings or valuables.               Contacts, dentures or bridgework may not be worn into surgery.  Leave your suitcase in the car. After surgery it may be brought to your room.  For patients admitted to the hospital, discharge time is determined by your                 treatment team.   Patients discharged the day of surgery will not be allowed to drive home.   Please read over the following fact sheets that you were given:   MRSA Information   __X__ Take these medicines the morning of surgery with A SIP OF WATER:    1. SYNTHROID  2. LORATADINE  3. RANITIDINE AT BEDTIME AND AM OF SURGERY  4.  5.  6.  ____ Fleet Enema (as directed)   __X__ Use CHG Soap as directed  ____ Use inhalers on the day of surgery  ____ Stop metformin 2 days prior to surgery    ____ Take 1/2 of usual insulin dose the night before surgery and none on the morning of surgery.   ____ Stop Coumadin/Plavix/aspirin on   __X__ Stop Anti-inflammatories such as Advil, Aleve, Ibuprofen, Motrin, Naproxen, Naprosyn, Goodies,powder, or aspirin products.  OK to take Tylenol.   ____ Stop supplements until after surgery.    ____ Bring C-Pap to the hospital.

## 2017-08-10 LAB — URINE CULTURE
Culture: NO GROWTH
SPECIAL REQUESTS: NORMAL

## 2017-08-22 MED ORDER — CEFAZOLIN SODIUM-DEXTROSE 2-4 GM/100ML-% IV SOLN: 2.0000 g | INTRAVENOUS | Status: DC

## 2017-08-22 MED ORDER — TRANEXAMIC ACID 1000 MG/10ML IV SOLN
1000.0000 mg | INTRAVENOUS | Status: DC
  Filled 2017-08-22: qty 10

## 2017-08-23 ENCOUNTER — Inpatient Hospital Stay: Payer: Managed Care, Other (non HMO) | Admitting: Anesthesiology

## 2017-08-23 ENCOUNTER — Encounter: Payer: Self-pay | Admitting: Orthopedic Surgery

## 2017-08-23 ENCOUNTER — Encounter
Admission: RE | Disposition: A | Discharge status: Home / Self Care | Payer: Self-pay | Source: Ambulatory Visit | Attending: Orthopedic Surgery

## 2017-08-23 ENCOUNTER — Inpatient Hospital Stay: Payer: Managed Care, Other (non HMO)

## 2017-08-23 ENCOUNTER — Inpatient Hospital Stay
Admission: RE | Admit: 2017-08-23 | Discharge: 2017-08-25 | DRG: 470 | Disposition: A | Discharge status: Home / Self Care | Payer: Managed Care, Other (non HMO) | Source: Ambulatory Visit | Attending: Orthopedic Surgery | Admitting: Orthopedic Surgery

## 2017-08-23 DIAGNOSIS — Z96659 Presence of unspecified artificial knee joint: Secondary | ICD-10-CM

## 2017-08-23 DIAGNOSIS — K219 Gastro-esophageal reflux disease without esophagitis: Secondary | ICD-10-CM | POA: Diagnosis present

## 2017-08-23 DIAGNOSIS — Z85828 Personal history of other malignant neoplasm of skin: Secondary | ICD-10-CM | POA: Diagnosis not present

## 2017-08-23 DIAGNOSIS — E039 Hypothyroidism, unspecified: Secondary | ICD-10-CM | POA: Diagnosis present

## 2017-08-23 DIAGNOSIS — M1712 Unilateral primary osteoarthritis, left knee: Principal | ICD-10-CM | POA: Diagnosis present

## 2017-08-23 DIAGNOSIS — I10 Essential (primary) hypertension: Secondary | ICD-10-CM | POA: Diagnosis present

## 2017-08-23 HISTORY — PX: KNEE ARTHROPLASTY: SHX992

## 2017-08-23 LAB — POCT I-STAT 4, (NA,K, GLUC, HGB,HCT)
Glucose, Bld: 113 mg/dL — ABNORMAL HIGH (ref 65–99)
HEMATOCRIT: 38 % (ref 36.0–46.0)
HEMOGLOBIN: 12.9 g/dL (ref 12.0–15.0)
POTASSIUM: 3.9 mmol/L (ref 3.5–5.1)
SODIUM: 141 mmol/L (ref 135–145)

## 2017-08-23 SURGERY — ARTHROPLASTY, KNEE, TOTAL, USING IMAGELESS COMPUTER-ASSISTED NAVIGATION
Anesthesia: Spinal | Laterality: Left | Wound class: Clean

## 2017-08-23 MED ORDER — ONDANSETRON HCL 4 MG/2ML IJ SOLN
INTRAMUSCULAR | Status: AC
  Filled 2017-08-23: qty 2

## 2017-08-23 MED ORDER — ONDANSETRON HCL 4 MG PO TABS: 4.0000 mg | ORAL_TABLET | Freq: Four times a day (QID) | ORAL | Status: DC | PRN

## 2017-08-23 MED ORDER — ENOXAPARIN SODIUM 30 MG/0.3ML ~~LOC~~ SOLN
30.0000 mg | Freq: Two times a day (BID) | SUBCUTANEOUS | Status: DC
  Administered 2017-08-24 – 2017-08-25 (×3): 30 mg via SUBCUTANEOUS
  Filled 2017-08-23 (×3): qty 0.3

## 2017-08-23 MED ORDER — ACETAMINOPHEN 10 MG/ML IV SOLN
INTRAVENOUS | Status: DC | PRN
  Administered 2017-08-23: 1000 mg via INTRAVENOUS

## 2017-08-23 MED ORDER — HYDROCHLOROTHIAZIDE 12.5 MG PO CAPS
12.5000 mg | ORAL_CAPSULE | Freq: Every day | ORAL | Status: DC
  Administered 2017-08-24 – 2017-08-25 (×2): 12.5 mg via ORAL
  Filled 2017-08-23 (×2): qty 1

## 2017-08-23 MED ORDER — PROPOFOL 10 MG/ML IV BOLUS
INTRAVENOUS | Status: DC | PRN
Start: 2017-08-23 — End: 2017-08-23
  Administered 2017-08-23: 30 mg via INTRAVENOUS
  Administered 2017-08-23 (×5): 22 mg via INTRAVENOUS

## 2017-08-23 MED ORDER — ACETAMINOPHEN 325 MG PO TABS: 650.0000 mg | ORAL_TABLET | Freq: Four times a day (QID) | ORAL | Status: DC | PRN

## 2017-08-23 MED ORDER — PROPOFOL 500 MG/50ML IV EMUL
INTRAVENOUS | Status: DC | PRN
  Administered 2017-08-23: 50 ug/kg/min via INTRAVENOUS

## 2017-08-23 MED ORDER — SODIUM CHLORIDE 0.9 % IV SOLN
INTRAVENOUS | Status: DC | PRN
  Administered 2017-08-23: 20 ug/min via INTRAVENOUS

## 2017-08-23 MED ORDER — MIDAZOLAM HCL 2 MG/2ML IJ SOLN
INTRAMUSCULAR | Status: AC
  Filled 2017-08-23: qty 2

## 2017-08-23 MED ORDER — TRAMADOL HCL 50 MG PO TABS
50.0000 mg | ORAL_TABLET | ORAL | Status: DC | PRN
  Administered 2017-08-23: 50 mg via ORAL
  Administered 2017-08-24 – 2017-08-25 (×7): 100 mg via ORAL
  Filled 2017-08-23: qty 1
  Filled 2017-08-23 (×7): qty 2

## 2017-08-23 MED ORDER — CEFAZOLIN SODIUM-DEXTROSE 2-4 GM/100ML-% IV SOLN
2.0000 g | Freq: Four times a day (QID) | INTRAVENOUS | Status: AC
  Administered 2017-08-23 – 2017-08-24 (×3): 2 g via INTRAVENOUS
  Filled 2017-08-23 (×4): qty 100

## 2017-08-23 MED ORDER — MIDAZOLAM HCL 5 MG/5ML IJ SOLN
INTRAMUSCULAR | Status: DC | PRN
  Administered 2017-08-23: 2 mg via INTRAVENOUS

## 2017-08-23 MED ORDER — LACTATED RINGERS IV SOLN
INTRAVENOUS | Status: DC
  Administered 2017-08-23 (×2): via INTRAVENOUS

## 2017-08-23 MED ORDER — SODIUM CHLORIDE 0.9 % IV SOLN
INTRAVENOUS | Status: DC | PRN
  Administered 2017-08-23: 60 mL

## 2017-08-23 MED ORDER — LOSARTAN POTASSIUM-HCTZ 100-12.5 MG PO TABS: 1.0000 | ORAL_TABLET | Freq: Every day | ORAL | Status: DC

## 2017-08-23 MED ORDER — KETAMINE HCL 50 MG/ML IJ SOLN
INTRAMUSCULAR | Status: DC | PRN
  Administered 2017-08-23 (×2): 2.2 mg via INTRAMUSCULAR
  Administered 2017-08-23: 3 mg via INTRAMUSCULAR
  Administered 2017-08-23 (×3): 2.2 mg via INTRAMUSCULAR

## 2017-08-23 MED ORDER — MAGNESIUM HYDROXIDE 400 MG/5ML PO SUSP
30.0000 mL | Freq: Every day | ORAL | Status: DC | PRN
  Administered 2017-08-25: 30 mL via ORAL
  Filled 2017-08-23: qty 30

## 2017-08-23 MED ORDER — BISACODYL 10 MG RE SUPP
10.0000 mg | Freq: Every day | RECTAL | Status: DC | PRN
  Administered 2017-08-25: 10 mg via RECTAL
  Filled 2017-08-23: qty 1

## 2017-08-23 MED ORDER — SODIUM CHLORIDE 0.9 % IV SOLN
INTRAVENOUS | Status: DC
  Administered 2017-08-23: 18:00:00 via INTRAVENOUS

## 2017-08-23 MED ORDER — LEVOTHYROXINE SODIUM 100 MCG PO TABS
200.0000 ug | ORAL_TABLET | Freq: Every day | ORAL | Status: DC
  Administered 2017-08-24 – 2017-08-25 (×2): 200 ug via ORAL
  Filled 2017-08-23 (×2): qty 2

## 2017-08-23 MED ORDER — FENTANYL CITRATE (PF) 100 MCG/2ML IJ SOLN
INTRAMUSCULAR | Status: AC
  Filled 2017-08-23: qty 2

## 2017-08-23 MED ORDER — BUPIVACAINE HCL (PF) 0.25 % IJ SOLN
INTRAMUSCULAR | Status: AC
  Filled 2017-08-23: qty 30

## 2017-08-23 MED ORDER — BUPIVACAINE HCL (PF) 0.25 % IJ SOLN
INTRAMUSCULAR | Status: DC | PRN
  Administered 2017-08-23: 60 mL

## 2017-08-23 MED ORDER — PHENOL 1.4 % MT LIQD
1.0000 | OROMUCOSAL | Status: DC | PRN
  Filled 2017-08-23: qty 177

## 2017-08-23 MED ORDER — OXYCODONE HCL 5 MG PO TABS
5.0000 mg | ORAL_TABLET | ORAL | Status: DC | PRN
  Administered 2017-08-23: 10 mg via ORAL
  Administered 2017-08-23 (×2): 5 mg via ORAL
  Administered 2017-08-24 (×5): 10 mg via ORAL
  Administered 2017-08-25: 5 mg via ORAL
  Administered 2017-08-25: 10 mg via ORAL
  Filled 2017-08-23 (×3): qty 2
  Filled 2017-08-23 (×4): qty 1
  Filled 2017-08-23 (×4): qty 2

## 2017-08-23 MED ORDER — NEOMYCIN-POLYMYXIN B GU 40-200000 IR SOLN
Status: DC | PRN
  Administered 2017-08-23: 12 mL

## 2017-08-23 MED ORDER — BUPIVACAINE LIPOSOME 1.3 % IJ SUSP
INTRAMUSCULAR | Status: AC
Start: 2017-08-23 — End: 2017-08-23
  Filled 2017-08-23: qty 20

## 2017-08-23 MED ORDER — ALUM & MAG HYDROXIDE-SIMETH 200-200-20 MG/5ML PO SUSP: 30.0000 mL | ORAL | Status: DC | PRN

## 2017-08-23 MED ORDER — DIPHENHYDRAMINE HCL 12.5 MG/5ML PO ELIX: 12.5000 mg | ORAL_SOLUTION | ORAL | Status: DC | PRN

## 2017-08-23 MED ORDER — DEXAMETHASONE SODIUM PHOSPHATE 4 MG/ML IJ SOLN
INTRAMUSCULAR | Status: DC | PRN
  Administered 2017-08-23: 5 mg via INTRAVENOUS

## 2017-08-23 MED ORDER — FLEET ENEMA 7-19 GM/118ML RE ENEM: 1.0000 | ENEMA | Freq: Once | RECTAL | Status: DC | PRN

## 2017-08-23 MED ORDER — ACETAMINOPHEN 10 MG/ML IV SOLN
INTRAVENOUS | Status: AC
  Filled 2017-08-23: qty 100

## 2017-08-23 MED ORDER — TRANEXAMIC ACID 1000 MG/10ML IV SOLN
1000.0000 mg | Freq: Once | INTRAVENOUS | Status: AC
  Administered 2017-08-23: 1000 mg via INTRAVENOUS
  Filled 2017-08-23: qty 10

## 2017-08-23 MED ORDER — ONDANSETRON HCL 4 MG/2ML IJ SOLN
4.0000 mg | Freq: Once | INTRAMUSCULAR | Status: AC | PRN
  Administered 2017-08-23: 4 mg via INTRAVENOUS

## 2017-08-23 MED ORDER — PANTOPRAZOLE SODIUM 40 MG PO TBEC
40.0000 mg | DELAYED_RELEASE_TABLET | Freq: Two times a day (BID) | ORAL | Status: DC
  Administered 2017-08-23 – 2017-08-25 (×4): 40 mg via ORAL
  Filled 2017-08-23 (×4): qty 1

## 2017-08-23 MED ORDER — ESTRADIOL-NORETHINDRONE ACET 0.5-0.1 MG PO TABS
1.0000 | ORAL_TABLET | Freq: Every day | ORAL | Status: DC
  Administered 2017-08-24: 1 via ORAL

## 2017-08-23 MED ORDER — MAGNESIUM OXIDE 400 (241.3 MG) MG PO TABS
400.0000 mg | ORAL_TABLET | Freq: Every day | ORAL | Status: DC
  Administered 2017-08-24 – 2017-08-25 (×2): 400 mg via ORAL
  Filled 2017-08-23 (×2): qty 1

## 2017-08-23 MED ORDER — GLYCOPYRROLATE 0.2 MG/ML IJ SOLN
INTRAMUSCULAR | Status: DC | PRN
  Administered 2017-08-23: 0.3 mg via INTRAVENOUS

## 2017-08-23 MED ORDER — METOCLOPRAMIDE HCL 10 MG PO TABS
10.0000 mg | ORAL_TABLET | Freq: Three times a day (TID) | ORAL | Status: DC
  Administered 2017-08-23 – 2017-08-25 (×5): 10 mg via ORAL
  Filled 2017-08-23 (×6): qty 1

## 2017-08-23 MED ORDER — ACETAMINOPHEN 10 MG/ML IV SOLN
1000.0000 mg | Freq: Four times a day (QID) | INTRAVENOUS | Status: AC
  Administered 2017-08-23 – 2017-08-24 (×4): 1000 mg via INTRAVENOUS
  Filled 2017-08-23 (×4): qty 100

## 2017-08-23 MED ORDER — MORPHINE SULFATE (PF) 2 MG/ML IV SOLN
2.0000 mg | INTRAVENOUS | Status: DC | PRN
  Administered 2017-08-23: 2 mg via INTRAVENOUS
  Filled 2017-08-23: qty 1

## 2017-08-23 MED ORDER — CHLORHEXIDINE GLUCONATE 4 % EX LIQD: 60.0000 mL | Freq: Once | CUTANEOUS | Status: DC

## 2017-08-23 MED ORDER — SODIUM CHLORIDE 0.9 % IJ SOLN
INTRAMUSCULAR | Status: AC
  Filled 2017-08-23: qty 50

## 2017-08-23 MED ORDER — BUPIVACAINE HCL (PF) 0.5 % IJ SOLN
INTRAMUSCULAR | Status: DC | PRN
  Administered 2017-08-23: 3 mL

## 2017-08-23 MED ORDER — ACETAMINOPHEN 650 MG RE SUPP
650.0000 mg | Freq: Four times a day (QID) | RECTAL | Status: DC | PRN
Start: 2017-08-23 — End: 2017-08-25

## 2017-08-23 MED ORDER — PROPOFOL 500 MG/50ML IV EMUL
INTRAVENOUS | Status: AC
  Filled 2017-08-23: qty 50

## 2017-08-23 MED ORDER — MENTHOL 3 MG MT LOZG
1.0000 | LOZENGE | OROMUCOSAL | Status: DC | PRN
  Filled 2017-08-23: qty 9

## 2017-08-23 MED ORDER — KETAMINE HCL 100 MG/ML IJ SOLN
INTRAMUSCULAR | Status: DC | PRN
  Administered 2017-08-23: 5 ug/kg/min via INTRAVENOUS

## 2017-08-23 MED ORDER — FENTANYL CITRATE (PF) 100 MCG/2ML IJ SOLN
INTRAMUSCULAR | Status: DC | PRN
  Administered 2017-08-23 (×2): 50 ug via INTRAVENOUS

## 2017-08-23 MED ORDER — NEOMYCIN-POLYMYXIN B GU 40-200000 IR SOLN
Status: AC
  Filled 2017-08-23: qty 20

## 2017-08-23 MED ORDER — FERROUS SULFATE 325 (65 FE) MG PO TABS
325.0000 mg | ORAL_TABLET | Freq: Two times a day (BID) | ORAL | Status: DC
  Administered 2017-08-24 – 2017-08-25 (×3): 325 mg via ORAL
  Filled 2017-08-23 (×3): qty 1

## 2017-08-23 MED ORDER — SENNOSIDES-DOCUSATE SODIUM 8.6-50 MG PO TABS
1.0000 | ORAL_TABLET | Freq: Two times a day (BID) | ORAL | Status: DC
  Administered 2017-08-23 – 2017-08-25 (×4): 1 via ORAL
  Filled 2017-08-23 (×4): qty 1

## 2017-08-23 MED ORDER — POTASSIUM CHLORIDE CRYS ER 10 MEQ PO TBCR
10.0000 meq | EXTENDED_RELEASE_TABLET | Freq: Every day | ORAL | Status: DC
  Administered 2017-08-24 – 2017-08-25 (×2): 10 meq via ORAL
  Filled 2017-08-23 (×3): qty 1

## 2017-08-23 MED ORDER — LOSARTAN POTASSIUM 50 MG PO TABS
100.0000 mg | ORAL_TABLET | Freq: Every day | ORAL | Status: DC
  Administered 2017-08-24 – 2017-08-25 (×2): 100 mg via ORAL
  Filled 2017-08-23 (×2): qty 2

## 2017-08-23 MED ORDER — FENTANYL CITRATE (PF) 100 MCG/2ML IJ SOLN
25.0000 ug | INTRAMUSCULAR | Status: DC | PRN
  Administered 2017-08-23: 25 ug via INTRAVENOUS

## 2017-08-23 MED ORDER — LORATADINE 10 MG PO TABS
10.0000 mg | ORAL_TABLET | Freq: Every day | ORAL | Status: DC
  Administered 2017-08-24 – 2017-08-25 (×2): 10 mg via ORAL
  Filled 2017-08-23 (×2): qty 1

## 2017-08-23 MED ORDER — ONDANSETRON HCL 4 MG/2ML IJ SOLN: 4.0000 mg | Freq: Four times a day (QID) | INTRAMUSCULAR | Status: DC | PRN

## 2017-08-23 MED ORDER — CEFAZOLIN SODIUM-DEXTROSE 2-4 GM/100ML-% IV SOLN
INTRAVENOUS | Status: AC
  Filled 2017-08-23: qty 100

## 2017-08-23 MED ORDER — LIDOCAINE HCL (CARDIAC) 20 MG/ML IV SOLN
INTRAVENOUS | Status: DC | PRN
  Administered 2017-08-23: 80 mg via INTRAVENOUS

## 2017-08-23 SURGICAL SUPPLY — 64 items
BATTERY INSTRU NAVIGATION (MISCELLANEOUS) ×12 IMPLANT
BLADE SAW 1 (BLADE) ×3 IMPLANT
BLADE SAW 1/2 (BLADE) ×3 IMPLANT
BLADE SAW 70X12.5 (BLADE) IMPLANT
BONE CEMENT GENTAMICIN (Cement) ×6 IMPLANT
CANISTER SUCT 1200ML W/VALVE (MISCELLANEOUS) ×3 IMPLANT
CANISTER SUCT 3000ML PPV (MISCELLANEOUS) ×6 IMPLANT
CAPT KNEE TOTAL 3 ATTUNE ×3 IMPLANT
CATH TRAY METER 16FR LF (MISCELLANEOUS) ×3 IMPLANT
CEMENT BONE GENTAMICIN 40 (Cement) ×2 IMPLANT
COOLER POLAR GLACIER W/PUMP (MISCELLANEOUS) ×3 IMPLANT
CUFF TOURN 24 STER (MISCELLANEOUS) IMPLANT
CUFF TOURN 30 STER DUAL PORT (MISCELLANEOUS) IMPLANT
CUFF TOURN 34 STER (MISCELLANEOUS) ×3 IMPLANT
DRAPE SHEET LG 3/4 BI-LAMINATE (DRAPES) ×3 IMPLANT
DRSG DERMACEA 8X12 NADH (GAUZE/BANDAGES/DRESSINGS) ×3 IMPLANT
DRSG OPSITE POSTOP 4X14 (GAUZE/BANDAGES/DRESSINGS) ×3 IMPLANT
DRSG TEGADERM 4X4.75 (GAUZE/BANDAGES/DRESSINGS) ×3 IMPLANT
DURAPREP 26ML APPLICATOR (WOUND CARE) ×6 IMPLANT
ELECT CAUTERY BLADE 6.4 (BLADE) ×3 IMPLANT
ELECT REM PT RETURN 9FT ADLT (ELECTROSURGICAL) ×3
ELECTRODE REM PT RTRN 9FT ADLT (ELECTROSURGICAL) ×1 IMPLANT
EVACUATOR 1/8 PVC DRAIN (DRAIN) ×3 IMPLANT
EX-PIN ORTHOLOCK NAV 4X150 (PIN) ×6 IMPLANT
GLOVE BIOGEL M STRL SZ7.5 (GLOVE) ×6 IMPLANT
GLOVE BIOGEL PI IND STRL 9 (GLOVE) ×1 IMPLANT
GLOVE BIOGEL PI INDICATOR 9 (GLOVE) ×2
GLOVE INDICATOR 8.0 STRL GRN (GLOVE) ×3 IMPLANT
GLOVE SURG SYN 9.0  PF PI (GLOVE) ×2
GLOVE SURG SYN 9.0 PF PI (GLOVE) ×1 IMPLANT
GOWN STRL REUS W/ TWL LRG LVL3 (GOWN DISPOSABLE) ×2 IMPLANT
GOWN STRL REUS W/TWL 2XL LVL3 (GOWN DISPOSABLE) ×3 IMPLANT
GOWN STRL REUS W/TWL LRG LVL3 (GOWN DISPOSABLE) ×4
HOLDER FOLEY CATH W/STRAP (MISCELLANEOUS) ×3 IMPLANT
HOOD PEEL AWAY FLYTE STAYCOOL (MISCELLANEOUS) ×6 IMPLANT
KIT RM TURNOVER STRD PROC AR (KITS) ×3 IMPLANT
KNIFE SCULPS 14X20 (INSTRUMENTS) ×3 IMPLANT
LABEL OR SOLS (LABEL) ×3 IMPLANT
NDL SAFETY 18GX1.5 (NEEDLE) ×3 IMPLANT
NEEDLE SPNL 20GX3.5 QUINCKE YW (NEEDLE) ×3 IMPLANT
NS IRRIG 500ML POUR BTL (IV SOLUTION) ×3 IMPLANT
PACK TOTAL KNEE (MISCELLANEOUS) ×3 IMPLANT
PAD WRAPON POLAR KNEE (MISCELLANEOUS) ×1 IMPLANT
PIN DRILL QUICK PACK ×3 IMPLANT
PIN FIXATION 1/8DIA X 3INL (PIN) ×3 IMPLANT
PULSAVAC PLUS IRRIG FAN TIP (DISPOSABLE) ×3
SOL .9 NS 3000ML IRR  AL (IV SOLUTION) ×2
SOL .9 NS 3000ML IRR UROMATIC (IV SOLUTION) ×1 IMPLANT
SOL PREP PVP 2OZ (MISCELLANEOUS) ×3
SOLUTION PREP PVP 2OZ (MISCELLANEOUS) ×1 IMPLANT
SPONGE DRAIN TRACH 4X4 STRL 2S (GAUZE/BANDAGES/DRESSINGS) ×3 IMPLANT
STAPLER SKIN PROX 35W (STAPLE) ×3 IMPLANT
STRAP TIBIA SHORT (MISCELLANEOUS) ×3 IMPLANT
SUCTION FRAZIER HANDLE 10FR (MISCELLANEOUS) ×2
SUCTION TUBE FRAZIER 10FR DISP (MISCELLANEOUS) ×1 IMPLANT
SUT VIC AB 0 CT1 36 (SUTURE) ×3 IMPLANT
SUT VIC AB 1 CT1 36 (SUTURE) ×6 IMPLANT
SUT VIC AB 2-0 CT2 27 (SUTURE) ×3 IMPLANT
SYR 20CC LL (SYRINGE) ×3 IMPLANT
SYR 30ML LL (SYRINGE) ×6 IMPLANT
TIP FAN IRRIG PULSAVAC PLUS (DISPOSABLE) ×1 IMPLANT
TOWEL OR 17X26 4PK STRL BLUE (TOWEL DISPOSABLE) ×3 IMPLANT
TOWER CARTRIDGE SMART MIX (DISPOSABLE) ×3 IMPLANT
WRAPON POLAR PAD KNEE (MISCELLANEOUS) ×3

## 2017-08-23 NOTE — Anesthesia Procedure Notes (Signed)
Spinal  Patient location during procedure: OR Start time: 08/23/2017 11:38 AM End time: 08/23/2017 11:43 AM Staffing Performed: resident/CRNA  Preanesthetic Checklist Completed: patient identified, site marked, surgical consent, pre-op evaluation, timeout performed, IV checked, risks and benefits discussed and monitors and equipment checked Spinal Block Patient position: sitting Prep: Betadine Patient monitoring: heart rate, continuous pulse ox, blood pressure and cardiac monitor Approach: midline Location: L4-5 Injection technique: single-shot Needle Needle type: Introducer and Pencan  Needle gauge: 24 G Needle length: 9 cm Additional Notes Negative paresthesia. Negative blood return. Positive free-flowing CSF. Expiration date of kit checked and confirmed. Patient tolerated procedure well, without complications.

## 2017-08-23 NOTE — Op Note (Signed)
OPERATIVE NOTE  DATE OF SURGERY:  08/23/2017  PATIENT NAME:  Amanda Holmes   DOB: 01-31-1951  MRN: [id number]  PRE-OPERATIVE DIAGNOSIS: Degenerative arthrosis of the left knee, primary  POST-OPERATIVE DIAGNOSIS:  Same  PROCEDURE:  Left total knee arthroplasty using computer-assisted navigation  SURGEON:  Marciano Sequin. M.D.  ASSISTANT:  Vance Peper, PA (present and scrubbed throughout the case, critical for assistance with exposure, retraction, instrumentation, and closure)  ANESTHESIA: spinal  ESTIMATED BLOOD LOSS: 100 mL  FLUIDS REPLACED: 1400 mL of crystalloid  TOURNIQUET TIME: 120 minutes  DRAINS: 2 medium Hemovac drains  SOFT TISSUE RELEASES: Anterior cruciate ligament, posterior cruciate ligament, deep medial collateral ligament, patellofemoral ligament, posterolateral corner  IMPLANTS UTILIZED: DePuy Attune size 6N posterior stabilized femoral component (cemented), size 5 rotating platform tibial component (cemented), 35 mm medialized dome patella (cemented), and a 5 mm stabilized rotating platform polyethylene insert.  INDICATIONS FOR SURGERY: Amanda Holmes is a 66 y.o. year old female with a long history of progressive knee pain. X-rays demonstrated severe degenerative changes in tricompartmental fashion. The patient had not seen any significant improvement despite conservative nonsurgical intervention. After discussion of the risks and benefits of surgical intervention, the patient expressed understanding of the risks benefits and agree with plans for total knee arthroplasty.   The risks, benefits, and alternatives were discussed at length including but not limited to the risks of infection, bleeding, nerve injury, stiffness, blood clots, the need for revision surgery, cardiopulmonary complications, among others, and they were willing to proceed.  PROCEDURE IN DETAIL: The patient was brought into the operating room and, after adequate spinal anesthesia was  achieved, a tourniquet was placed on the patient's upper thigh. The patient's knee and leg were cleaned and prepped with alcohol and DuraPrep and draped in the usual sterile fashion. A "timeout" was performed as per usual protocol. The lower extremity was exsanguinated using an Esmarch, and the tourniquet was inflated to 300 mmHg. An anterior longitudinal incision was made followed by a standard mid vastus approach. The deep fibers of the medial collateral ligament were elevated in a subperiosteal fashion off of the medial flare of the tibia so as to maintain a continuous soft tissue sleeve. The patella was subluxed laterally and the patellofemoral ligament was incised. Inspection of the knee demonstrated severe degenerative changes with full-thickness loss of articular cartilage. Osteophytes were debrided using a rongeur. Anterior and posterior cruciate ligaments were excised. Two 4.0 mm Schanz pins were inserted in the femur and into the tibia for attachment of the array of trackers used for computer-assisted navigation. Hip center was identified using a circumduction technique. Distal landmarks were mapped using the computer. The distal femur and proximal tibia were mapped using the computer. The distal femoral cutting guide was positioned using computer-assisted navigation so as to achieve a 5 distal valgus cut. The femur was sized and it was felt that a size 6N femoral component was appropriate. A size 6 femoral cutting guide was positioned and the anterior cut was performed and verified using the computer. This was followed by completion of the posterior and chamfer cuts. Femoral cutting guide for the central box was then positioned in the center box cut was performed.  Attention was then directed to the proximal tibia. Medial and lateral menisci were excised. The extramedullary tibial cutting guide was positioned using computer-assisted navigation so as to achieve a 0 varus-valgus alignment and 3  posterior slope. The cut was performed and verified using the computer. The  proximal tibia was sized and it was felt that a size 5 tibial tray was appropriate. Tibial and femoral trials were inserted followed by insertion of a 5 mm polyethylene insert. The knee was felt to be tight laterally. The trial implants were removed and the was brought in full extension and distracted using the Moreland retractors. The posterolateral corner was carefully released using combination of Metzenbaum scissors and electrocautery. Trial components were reinserted. This allowed for excellent mediolateral soft tissue balancing both in flexion and in full extension. Finally, the patella was cut and prepared so as to accommodate a 35 mm medialized dome patella. A patella trial was placed and the knee was placed through a range of motion with excellent patellar tracking appreciated. The femoral trial was removed after debridement of posterior osteophytes. The central post-hole for the tibial component was reamed followed by insertion of a keel punch. Tibial trials were then removed. Cut surfaces of bone were irrigated with copious amounts of normal saline with antibiotic solution using pulsatile lavage and then suctioned dry. Polymethylmethacrylate cement with gentamicin was prepared in the usual fashion using a vacuum mixer. Cement was applied to the cut surface of the proximal tibia as well as along the undersurface of a size 5 rotating platform tibial component. Tibial component was positioned and impacted into place. Excess cement was removed using [employer] fast streamer. Cement was then applied to the cut surfaces of the femur as well as along the posterior flanges of the size 6N femoral component. The femoral component was positioned and impacted into place. Excess cement was removed using [employer] fast streamer. A 5 mm polyethylene trial was inserted and the knee was brought into full extension with steady axial compression applied. Finally,  cement was applied to the backside of a 35 mm medialized dome patella and the patellar component was positioned and patellar clamp applied. Excess cement was removed using [employer] fast streamer. After adequate curing of the cement, the tourniquet was deflated after a total tourniquet time of 120 minutes. Hemostasis was achieved using electrocautery. The knee was irrigated with copious amounts of normal saline with antibiotic solution using pulsatile lavage and then suctioned dry. 20 mL of 1.3% Exparel and 60 mL of 0.25% Marcaine in 40 mL of normal saline was injected along the posterior capsule, medial and lateral gutters, and along the arthrotomy site. A 5 mm stabilized rotating platform polyethylene insert was inserted and the knee was placed through a range of motion with excellent mediolateral soft tissue balancing appreciated and excellent patellar tracking noted. 2 medium drains were placed in the wound bed and brought out through separate stab incisions. The medial parapatellar portion of the incision was reapproximated using interrupted sutures of #1 Vicryl. Subcutaneous tissue was approximated in layers using first #0 Vicryl followed #2-0 Vicryl. The skin was approximated with skin staples. A sterile dressing was applied.  The patient tolerated the procedure well and was transported to the recovery room in stable condition.    Njeri Vicente P. Holley Bouche., M.D.

## 2017-08-23 NOTE — Anesthesia Post-op Follow-up Note (Signed)
Anesthesia QCDR form completed.        

## 2017-08-23 NOTE — Anesthesia Preprocedure Evaluation (Signed)
Anesthesia Evaluation  Patient identified by MRN, date of birth, ID band Patient awake    Reviewed: Allergy & Precautions, H&P , NPO status , Patient's Chart, lab work & pertinent test results  History of Anesthesia Complications (+) PONV and history of anesthetic complications  Airway Mallampati: III  TM Distance: >3 FB Neck ROM: full    Dental  (+) Poor Dentition, Chipped, Missing   Pulmonary neg pulmonary ROS, neg shortness of breath,    Pulmonary exam normal breath sounds clear to auscultation       Cardiovascular Exercise Tolerance: Good hypertension, Pt. on medications (-) angina(-) Past MI and (-) DOE Normal cardiovascular exam Rhythm:regular Rate:Normal     Neuro/Psych negative neurological ROS  negative psych ROS   GI/Hepatic Neg liver ROS, GERD  Controlled,  Endo/Other  Hypothyroidism   Renal/GU negative Renal ROS  negative genitourinary   Musculoskeletal   Abdominal   Peds negative pediatric ROS (+)  Hematology negative hematology ROS (+)   Anesthesia Other Findings Past Medical History: No date: Cancer ([place])     Comment: skin ca squamous cell No date: GERD (gastroesophageal reflux disease) No date: Hypertension No date: Hypothyroidism No date: PONV (postoperative nausea and vomiting)  Past Surgical History: No date: GANGLION CYST EXCISION No date: TUBAL LIGATION     Reproductive/Obstetrics negative OB ROS                             Anesthesia Physical  Anesthesia Plan  ASA: III  Anesthesia Plan: Spinal   Post-op Pain Management:    Induction:   PONV Risk Score and Plan:   Airway Management Planned:   Additional Equipment:   Intra-op Plan:   Post-operative Plan:   Informed Consent: I have reviewed the patients History and Physical, chart, labs and discussed the procedure including the risks, benefits and alternatives for the proposed anesthesia  with the patient or authorized representative who has indicated his/her understanding and acceptance.   Dental Advisory Given and Dental advisory given  Plan Discussed with: Anesthesiologist, CRNA and Surgeon  Anesthesia Plan Comments:         Anesthesia Quick Evaluation

## 2017-08-23 NOTE — H&P (Signed)
The patient has been re-examined, and the chart reviewed, and there have been no interval changes to the documented history and physical.    The risks, benefits, and alternatives have been discussed at length. The patient expressed understanding of the risks benefits and agreed with plans for surgical intervention.  James P. Hooten, Jr. M.D.    

## 2017-08-23 NOTE — Transfer of Care (Signed)
Immediate Anesthesia Transfer of Care Note  Patient: Amanda Holmes  Procedure(s) Performed: Procedure(s): COMPUTER ASSISTED TOTAL KNEE ARTHROPLASTY (Left)  Patient Location: PACU  Anesthesia Type:Spinal  Level of Consciousness: awake and patient cooperative  Airway & Oxygen Therapy: Patient Spontanous Breathing and Patient connected to nasal cannula oxygen  Post-op Assessment: Report given to RN and Post -op Vital signs reviewed and stable  Post vital signs: Reviewed and stable  Last Vitals:  Vitals:   08/23/17 0953 08/23/17 1553  BP: (!) 144/68 136/74  Pulse: 73 74  Resp: 16 17  Temp: 37.1 C (!) 36.1 C  SpO2: 100% 100%    Last Pain: There were no vitals filed for this visit.       Complications: No apparent anesthesia complications

## 2017-08-24 ENCOUNTER — Encounter: Payer: Self-pay | Admitting: Orthopedic Surgery

## 2017-08-24 LAB — CBC
HEMATOCRIT: 35.5 % (ref 35.0–47.0)
Hemoglobin: 12.2 g/dL (ref 12.0–16.0)
MCH: 30.7 pg (ref 26.0–34.0)
MCHC: 34.2 g/dL (ref 32.0–36.0)
MCV: 89.9 fL (ref 80.0–100.0)
Platelets: 212 10*3/uL (ref 150–440)
RBC: 3.95 MIL/uL (ref 3.80–5.20)
RDW: 14.6 % — AB (ref 11.5–14.5)
WBC: 13.8 10*3/uL — AB (ref 3.6–11.0)

## 2017-08-24 LAB — BASIC METABOLIC PANEL
ANION GAP: 7 (ref 5–15)
BUN: 13 mg/dL (ref 6–20)
CALCIUM: 8.3 mg/dL — AB (ref 8.9–10.3)
CO2: 25 mmol/L (ref 22–32)
Chloride: 104 mmol/L (ref 101–111)
Creatinine, Ser: 0.85 mg/dL (ref 0.44–1.00)
GFR calc Af Amer: >60 mL/min (ref >60)
GFR calc non Af Amer: >60 mL/min (ref >60)
GLUCOSE: 199 mg/dL — AB (ref 65–99)
POTASSIUM: 3.9 mmol/L (ref 3.5–5.1)
Sodium: 136 mmol/L (ref 135–145)

## 2017-08-24 MED ORDER — ENOXAPARIN SODIUM 30 MG/0.3ML ~~LOC~~ SOLN: 40.0000 mg | SUBCUTANEOUS | 0 refills | Status: DC

## 2017-08-24 MED ORDER — TRAMADOL HCL 50 MG PO TABS: 50.0000 mg | ORAL_TABLET | ORAL | 0 refills | Status: DC | PRN

## 2017-08-24 MED ORDER — OXYCODONE HCL 5 MG PO TABS: 5.0000 mg | ORAL_TABLET | ORAL | 0 refills | Status: DC | PRN

## 2017-08-24 NOTE — Care Management Note (Signed)
Case Management Note  Patient Details  Name: Amanda Holmes MRN: [id number] Date of Birth: [birth year]  Subjective/Objective:  POD # 1 left TKA. Met with patient and her husband at bedside to discuss discharge planning. PT recommending OP PT.Appointment scheduled with [facility] for Monday, Sept. 17 at 3:30 pm. Patient updated and instructed to call to verify that they will be open due to inclement weather. Patient has a walker. Pharmacy: [facility] [address] : [phone]. Called Lovenox 40 mg # 14 no refills.   Action/Plan: OP PT at [facility], No DME needs, Lovenox called in.  Expected Discharge Date:                  Expected Discharge Plan:  OP Rehab  In-House Referral:     Discharge planning Services  CM Consult  Post Acute Care Choice:    Choice offered to:  Patient  DME Arranged:    DME Agency:     HH Arranged:    [facility] Agency:     Status of Service:  In process, will continue to follow  If discussed at Long Length of Stay Meetings, dates discussed:    Additional Comments:  Jolly Mango, RN 08/24/2017, 12:04 PM

## 2017-08-24 NOTE — Discharge Instructions (Signed)

## 2017-08-24 NOTE — Progress Notes (Signed)
Physical Therapy Treatment Patient Details Name: Amanda Holmes MRN: [id number] DOB: 1950-12-17 Today's Date: 08/24/2017    History of Present Illness admitted for acute hospitalization status post L TKR (08/23/17), WBAT.    PT Comments    Excellent efforts with continued mobility training.  ABle to complete lap around nursing sastion with RW, cga. Slow and guarded (10' walk time, 13-14 seconds), but good L knee control/stability.  Unable to progress towards full reciprocal stepping pattern due to pain; however, tolerating increased stance time/weight acceptance this PM Will plan to complete stair training next session as appropriate.    Follow Up Recommendations  Outpatient PT     Equipment Recommendations  Rolling walker with 5" wheels;3in1 (PT)    Recommendations for Other Services       Precautions / Restrictions Precautions Precautions: Fall Precaution Booklet Issued: Yes (comment) Restrictions Weight Bearing Restrictions: Yes LLE Weight Bearing: Weight bearing as tolerated    Mobility  Bed Mobility Overal bed mobility: Modified Independent Bed Mobility: Sit to Supine       Sit to supine: Modified independent (Device/Increase time)   General bed mobility comments: able to elevate L LE over edge of bed, use of momentum  Transfers Overall transfer level: Needs assistance Equipment used: Rolling walker (2 wheeled) Transfers: Sit to/from Stand Sit to Stand: Supervision;Min guard         General transfer comment: prefers bilat UEs on seating surface; moderate use of momentum to initiate lift off  Ambulation/Gait Ambulation/Gait assistance: Min guard Ambulation Distance (Feet): 220 Feet Assistive device: Rolling walker (2 wheeled)   Gait velocity: 10' walk time, 13-14 seconds   General Gait Details: continued cuing for postural, L hip extension throughout gait cycle; instruction for decreased force of R LE contact (to increase WBing L LE), encouaged to  progress towards reciprocal stepping pattern as able (limited tolerance due to pain)   Stairs            Wheelchair Mobility    Modified Rankin (Stroke Patients Only)       Balance Overall balance assessment: Needs assistance Sitting-balance support: No upper extremity supported;Feet supported Sitting balance-Leahy Scale: Good     Standing balance support: Bilateral upper extremity supported Standing balance-Leahy Scale: Fair                              Cognition Arousal/Alertness: Awake/alert Behavior During Therapy: WFL for tasks assessed/performed Overall Cognitive Status: Within Functional Limits for tasks assessed                                        Exercises Other Exercises Other Exercises: Toilet transfer, ambulatory with RW, cga/close sup; sit/stand from [facility] with RW, close sup (Requires use of bilat UEs).  Standing balance for clothing management, hand hygiene, close sup.  Min cuing for L LE position, promoting neutral standing position with progression towards symmetrical stance/WBing    General Comments        Pertinent Vitals/Pain Pain Assessment: Faces Faces Pain Scale: Hurts little more Pain Location: L knee Pain Descriptors / Indicators: Aching;Grimacing;Operative site guarding Pain Intervention(s): Limited activity within patient's tolerance;Monitored during session;Premedicated before session;Repositioned    Home Living                      Prior Function  PT Goals (current goals can now be found in the care plan section) Acute Rehab PT Goals Patient Stated Goal: "to regain independence again" PT Goal Formulation: With patient/family Time For Goal Achievement: 09/07/17 Potential to Achieve Goals: Good Progress towards PT goals: Progressing toward goals    Frequency    BID      PT Plan Current plan remains appropriate    Co-evaluation              AM-PAC PT "6 Clicks"  Daily Activity  Outcome Measure  Difficulty turning over in bed (including adjusting bedclothes, sheets and blankets)?: A Little Difficulty moving from lying on back to sitting on the side of the bed? : A Little Difficulty sitting down on and standing up from a chair with arms (e.g., wheelchair, bedside commode, etc,.)?: Unable Help needed moving to and from a bed to chair (including a wheelchair)?: A Little Help needed walking in hospital room?: A Little Help needed climbing 3-5 steps with a railing? : A Little 6 Click Score: 16    End of Session Equipment Utilized During Treatment: Gait belt Activity Tolerance: Patient tolerated treatment well Patient left: in bed;with call bell/phone within reach;with bed alarm set;with family/visitor present Nurse Communication: Mobility status PT Visit Diagnosis: Difficulty in walking, not elsewhere classified (R26.2);Muscle weakness (generalized) (M62.81);Pain Pain - Right/Left: Left Pain - part of body: Knee     Time: 1417-1440 PT Time Calculation (min) (ACUTE ONLY): 23 min  Charges:  $Gait Training: 8-22 mins $Therapeutic Activity: 8-22 mins                    G Codes:       Mylan Schwarz H. Owens Shark, PT, DPT, NCS 08/24/17, 9:43 PM [phone]

## 2017-08-24 NOTE — Discharge Summary (Signed)
Physician Discharge Summary  Patient ID: Amanda Holmes MRN: [id number] DOB/AGE: 1951/05/27 66 y.o.  Admit date: 08/23/2017 Discharge date: 08/25/2017  Admission Diagnoses:  PRIMARY OSTEOARTHRITIS LEFT KNEE   Discharge Diagnoses: Patient Active Problem List   Diagnosis Date Noted  . 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 Cancer (HCC)    skin ca squamous cell  . GERD (gastroesophageal reflux disease)   . Hypertension   . Hypothyroidism   . PONV (postoperative nausea and vomiting)      Transfusion: No transfusions on this admission   Consultants (if any):   Discharged Condition: Improved  Hospital Course: Amanda Holmes is an 66 y.o. female who was admitted 08/23/2017 with a diagnosis of degenerative arthrosis left knee and went to the operating room on 08/23/2017 and underwent the above named procedures.    Surgeries:Procedure(s): COMPUTER ASSISTED TOTAL KNEE ARTHROPLASTY on 08/23/2017  PRE-OPERATIVE DIAGNOSIS: Degenerative arthrosis of the left knee, primary  POST-OPERATIVE DIAGNOSIS:  Same  PROCEDURE:  Left total knee arthroplasty using computer-assisted navigation  SURGEON:  Marciano Sequin. M.D.  ASSISTANT:  Vance Peper, PA (present and scrubbed throughout the case, critical for assistance with exposure, retraction, instrumentation, and closure)  ANESTHESIA: spinal  ESTIMATED BLOOD LOSS: 100 mL  FLUIDS REPLACED: 1400 mL of crystalloid  TOURNIQUET TIME: 120 minutes  DRAINS: 2 medium Hemovac drains  SOFT TISSUE RELEASES: Anterior cruciate ligament, posterior cruciate ligament, deep medial collateral ligament, patellofemoral ligament, posterolateral corner  IMPLANTS UTILIZED: DePuy Attune size 6N posterior stabilized femoral component (cemented), size 5 rotating platform tibial component (cemented), 35 mm medialized dome patella (cemented), and a 5 mm stabilized rotating platform polyethylene  insert.  INDICATIONS FOR SURGERY: Amanda Holmes is a 66 y.o. year old female with a long history of progressive knee pain. X-rays demonstrated severe degenerative changes in tricompartmental fashion. The patient had not seen any significant improvement despite conservative nonsurgical intervention. After discussion of the risks and benefits of surgical intervention, the patient expressed understanding of the risks benefits and agree with plans for total knee arthroplasty.   The risks, benefits, and alternatives were discussed at length including but not limited to the risks of infection, bleeding, nerve injury, stiffness, blood clots, the need for revision surgery, cardiopulmonary complications, among others, and they were willing to proceed. Patient tolerated the surgery well. No complications .Patient was taken to PACU where she was stabilized and then transferred to the orthopedic floor.  Patient started on Lovenox 30 mg q 12 hrs. Foot pumps applied bilaterally at 80 mm hgb. Heels elevated off bed with rolled towels. No evidence of DVT. Calves non tender. Negative Homan. Physical therapy started on day #1 for gait training and transfer with OT starting on  day #1 for ADL and assisted devices. Patient has done well with therapy. Ambulated greater than 200 feet upon being discharged. Was able to ascend and descend 4 steps safely and independently  Patient's IV And Foley were discontinued on day #1 with Hemovac being discontinued on day #2. Dressing was changed on day 2 prior to patient being discharged   She was given perioperative antibiotics:  Anti-infectives    Start     Dose/Rate Route Frequency Ordered Stop   08/23/17 1800  ceFAZolin (ANCEF) IVPB 2g/100 mL premix     2 g 200 mL/hr over 30 Minutes Intravenous Every 6 hours 08/23/17 1726 08/24/17 1759   08/23/17 1042  ceFAZolin (ANCEF) 2-4 GM/100ML-% IVPB  Comments:  Dewayne Hatch   : cabinet override      08/23/17 1042 08/23/17  2259   08/23/17 0600  ceFAZolin (ANCEF) IVPB 2g/100 mL premix  Status:  Discontinued     2 g 200 mL/hr over 30 Minutes Intravenous On call to O.R. 08/22/17 2205 08/23/17 3382    .  She was fitted with AV 1 compression foot pump devices, instructed on heel pumps, early ambulation, and fitted with TED stockings bilaterally for DVT prophylaxis.  She benefited maximally from the hospital stay and there were no complications.    Recent vital signs:  Vitals:   08/23/17 2336 08/24/17 0500  BP: (!) 149/65 (!) 146/64  Pulse: 63 63  Resp: 18 18  Temp: 98.3 F (36.8 C) 97.9 F (36.6 C)  SpO2: 99% 96%    Recent laboratory studies:  Lab Results  Component Value Date   HGB 12.2 08/24/2017   HGB 12.9 08/23/2017   HGB 13.1 08/09/2017   Lab Results  Component Value Date   WBC 13.8 (H) 08/24/2017   PLT 212 08/24/2017   Lab Results  Component Value Date   INR 1.00 08/09/2017   Lab Results  Component Value Date   NA 136 08/24/2017   K 3.9 08/24/2017   CL 104 08/24/2017   CO2 25 08/24/2017   BUN 13 08/24/2017   CREATININE 0.85 08/24/2017   GLUCOSE 199 (H) 08/24/2017    Discharge Medications:   Allergies as of 08/24/2017      Reactions   Celebrex [celecoxib] Anaphylaxis, Shortness Of Breath      Medication List    TAKE these medications   acetaminophen 500 MG tablet Commonly known as:  TYLENOL Take 500-1,000 mg by mouth 2 (two) times daily. 2 tabs in the am, 1 tab at bedtime   AMABELZ 0.5-0.1 MG tablet Generic drug:  Estradiol-Norethindrone Acet Take 1 tablet by mouth daily.   enoxaparin 30 MG/0.3ML injection Commonly known as:  LOVENOX Inject 0.4 mLs (40 mg total) into the skin daily.   levothyroxine 200 MCG tablet Commonly known as:  SYNTHROID, LEVOTHROID Take 200 mcg by mouth daily before breakfast.   loratadine 10 MG tablet Commonly known as:  CLARITIN Take 10 mg by mouth daily.   losartan-hydrochlorothiazide 100-12.5 MG tablet Commonly known as:   HYZAAR Take 1 tablet by mouth daily.   magnesium oxide 400 MG tablet Commonly known as:  MAG-OX Take 400 mg by mouth daily.   oxyCODONE 5 MG immediate release tablet Commonly known as:  Oxy IR/ROXICODONE Take 1-2 tablets (5-10 mg total) by mouth every 4 (four) hours as needed for severe pain or breakthrough pain.   potassium chloride 10 MEQ tablet Commonly known as:  K-DUR Take 10 mEq by mouth daily.   ranitidine 150 MG tablet Commonly known as:  ZANTAC Take 150 mg by mouth daily as needed for heartburn.   traMADol 50 MG tablet Commonly known as:  ULTRAM Take 1 tablet (50 mg total) by mouth every 4 (four) hours as needed for moderate pain or severe pain. What changed:  when to take this            Durable Medical Equipment        Start     Ordered   08/23/17 1727  DME Walker rolling  Once    Question:  Patient needs a walker to treat with the following condition  Answer:  Total knee replacement status   08/23/17 1726   08/23/17 1727  DME Bedside commode  Once    Question:  Patient needs a bedside commode to treat with the following condition  Answer:  Total knee replacement status   08/23/17 1726       Discharge Care Instructions        Start     Ordered   08/24/17 0000  enoxaparin (LOVENOX) 30 MG/0.3ML injection  Every 24 hours     08/24/17 0717   08/24/17 0000  oxyCODONE (OXY IR/ROXICODONE) 5 MG immediate release tablet  Every 4 hours PRN     08/24/17 0717   08/24/17 0000  traMADol (ULTRAM) 50 MG tablet  Every 4 hours PRN     08/24/17 0717   08/24/17 0000  Increase activity slowly     08/24/17 0717   08/24/17 0000  Diet - low sodium heart healthy     08/24/17 0717      Diagnostic Studies: Dg Knee Left Port  Result Date: 08/23/2017 CLINICAL DATA:  66 year old female status post left knee replacement. EXAM: PORTABLE LEFT KNEE - 1-2 VIEW COMPARISON:  None. FINDINGS: AP and cross-table lateral views of the left knee. Sequelae of left total knee  arthroplasty. Two postoperative drains are in place. Hardware appears intact and normally aligned. Surrounding soft tissue postoperative changes, including anterior skin staples. IMPRESSION: Left total knee arthroplasty with no adverse features. Electronically Signed   By: Genevie Ann M.D.   On: 08/23/2017 16:17    Disposition: 01-Home or Self Care  Discharge Instructions    Diet - low sodium heart healthy    Complete by:  As directed    Increase activity slowly    Complete by:  As directed       Follow-up Information    Watt Climes, PA On 09/07/2017.   Specialty:  Physician Assistant Why:  at 1:15pm Contact information: [place] [address] [phone]        Dereck Leep, MD On 10/05/2017.   Specialty:  Orthopedic Surgery Why:  at 10:45am Contact information: [place] [address] [phone]            Signed: Watt Climes 08/24/2017, 7:18 AM

## 2017-08-24 NOTE — NC FL2 (Signed)
[place] LEVEL OF CARE SCREENING TOOL     IDENTIFICATION  Patient Name: Amanda Holmes Birthdate: 12-16-1950 Sex: female Admission Date (Current Location): 08/23/2017  [place] and [place] Number:  Engineering geologist and Address:  [facility], [address] [place], [place] [address]      Provider Number: [id number]  Attending Physician Name and Address:  Dereck Leep, MD  Relative Name and Phone Number:       Current Level of Care: Hospital Recommended Level of Care: [place] Prior Approval Number:    Date Approved/Denied:   PASRR Number:   [id number]  Discharge Plan: SNF    Current Diagnoses: Patient Active Problem List   Diagnosis Date Noted  . 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    Orientation RESPIRATION BLADDER Height & Weight     Self, Time, Situation, Place  Normal Continent Weight: 250 lb (113.4 kg) Height:     BEHAVIORAL SYMPTOMS/MOOD NEUROLOGICAL BOWEL NUTRITION STATUS      Continent Diet (Regular Diet)  AMBULATORY STATUS COMMUNICATION OF NEEDS Skin   Extensive Assist Verbally Surgical wounds (Left Knee)                       Personal Care Assistance Level of Assistance  Bathing, Feeding, Dressing Bathing Assistance: Limited assistance Feeding assistance: Independent Dressing Assistance: Limited assistance     Functional Limitations Info  Sight, Speech, Hearing Sight Info: Adequate Hearing Info: Adequate Speech Info: Adequate    SPECIAL CARE FACTORS FREQUENCY  PT (By licensed PT), OT (By licensed OT)     PT Frequency:  (5) OT Frequency:  (5)            Contractures      Additional Factors Info  Code Status, Allergies Code Status Info:  (Full Code) Allergies Info:  (Celebrex Celecoxib)           Current Medications (08/24/2017):  This is the current hospital active medication list Current Facility-Administered Medications  Medication Dose Route  Frequency Provider Last Rate Last Dose  . 0.9 %  sodium chloride infusion   Intravenous Continuous Hooten, Laurice Record, MD 100 mL/hr at 08/23/17 1737    . acetaminophen (OFIRMEV) IV 1,000 mg  1,000 mg Intravenous Q6H Hooten, Laurice Record, MD 400 mL/hr at 08/24/17 0531 1,000 mg at 08/24/17 0531  . acetaminophen (TYLENOL) tablet 650 mg  650 mg Oral Q6H PRN Hooten, Laurice Record, MD       Or  . acetaminophen (TYLENOL) suppository 650 mg  650 mg Rectal Q6H PRN Hooten, Laurice Record, MD      . alum & mag hydroxide-simeth (MAALOX/MYLANTA) 200-200-20 MG/5ML suspension 30 mL  30 mL Oral Q4H PRN Hooten, Laurice Record, MD      . bisacodyl (DULCOLAX) suppository 10 mg  10 mg Rectal Daily PRN Hooten, Laurice Record, MD      . ceFAZolin (ANCEF) IVPB 2g/100 mL premix  2 g Intravenous Q6H Hooten, Laurice Record, MD 200 mL/hr at 08/24/17 0624 2 g at 08/24/17 0624  . diphenhydrAMINE (BENADRYL) 12.5 MG/5ML elixir 12.5-25 mg  12.5-25 mg Oral Q4H PRN Hooten, Laurice Record, MD      . enoxaparin (LOVENOX) injection 30 mg  30 mg Subcutaneous Q12H Hooten, Laurice Record, MD   30 mg at 08/24/17 0703  . Estradiol-Norethindrone Acet 0.5-0.1 MG 1 tablet  1 tablet Oral Daily Hooten, Laurice Record, MD      . ferrous sulfate tablet 325  mg  325 mg Oral BID WC Dereck Leep, MD   325 mg at 08/24/17 0703  . losartan (COZAAR) tablet 100 mg  100 mg Oral Daily Hallaji, Sheema M, RPH       And  . hydrochlorothiazide (MICROZIDE) capsule 12.5 mg  12.5 mg Oral Daily Hallaji, Sheema M, RPH      . levothyroxine (SYNTHROID, LEVOTHROID) tablet 200 mcg  200 mcg Oral QAC breakfast Hooten, Laurice Record, MD   200 mcg at 08/24/17 6384  . loratadine (CLARITIN) tablet 10 mg  10 mg Oral Daily Hooten, Laurice Record, MD      . magnesium hydroxide (MILK OF MAGNESIA) suspension 30 mL  30 mL Oral Daily PRN Hooten, Laurice Record, MD      . magnesium oxide (MAG-OX) tablet 400 mg  400 mg Oral Daily Hooten, Laurice Record, MD      . menthol-cetylpyridinium (CEPACOL) lozenge 3 mg  1 lozenge Oral PRN Hooten, Laurice Record, MD       Or  .  phenol (CHLORASEPTIC) mouth spray 1 spray  1 spray Mouth/Throat PRN Hooten, Laurice Record, MD      . metoCLOPramide (REGLAN) tablet 10 mg  10 mg Oral TID AC & HS Hooten, Laurice Record, MD   10 mg at 08/24/17 0703  . morphine 2 MG/ML injection 2 mg  2 mg Intravenous Q2H PRN Hooten, Laurice Record, MD   2 mg at 08/23/17 1821  . ondansetron (ZOFRAN) tablet 4 mg  4 mg Oral Q6H PRN Hooten, Laurice Record, MD       Or  . ondansetron (ZOFRAN) injection 4 mg  4 mg Intravenous Q6H PRN Hooten, Laurice Record, MD      . oxyCODONE (Oxy IR/ROXICODONE) immediate release tablet 5-10 mg  5-10 mg Oral Q4H PRN Dereck Leep, MD   10 mg at 08/24/17 0703  . pantoprazole (PROTONIX) EC tablet 40 mg  40 mg Oral BID Dereck Leep, MD   40 mg at 08/23/17 2026  . potassium chloride (K-DUR,KLOR-CON) CR tablet 10 mEq  10 mEq Oral Daily Hooten, Laurice Record, MD      . senna-docusate (Senokot-S) tablet 1 tablet  1 tablet Oral BID Dereck Leep, MD   1 tablet at 08/23/17 2026  . sodium phosphate (FLEET) 7-19 GM/118ML enema 1 enema  1 enema Rectal Once PRN Hooten, Laurice Record, MD      . traMADol Veatrice Bourbon) tablet 50-100 mg  50-100 mg Oral Q4H PRN Dereck Leep, MD   100 mg at 08/24/17 0531     Discharge Medications: Please see discharge summary for a list of discharge medications.  Relevant Imaging Results:  Relevant Lab Results:   Additional Information  (SSN: [id number])  Smith Mince, Student-Social Work

## 2017-08-24 NOTE — Anesthesia Postprocedure Evaluation (Signed)
Anesthesia Post Note  Patient: Amanda Holmes  Procedure(s) Performed: Procedure(s) (LRB): COMPUTER ASSISTED TOTAL KNEE ARTHROPLASTY (Left)  Patient location during evaluation: Nursing Unit Anesthesia Type: Spinal Level of consciousness: oriented and awake and alert Pain management: pain level controlled Vital Signs Assessment: post-procedure vital signs reviewed and stable Respiratory status: spontaneous breathing, respiratory function stable and patient connected to nasal cannula oxygen Cardiovascular status: blood pressure returned to baseline and stable Postop Assessment: no headache and no backache Anesthetic complications: no     Last Vitals:  Vitals:   08/23/17 2336 08/24/17 0500  BP: (!) 149/65 (!) 146/64  Pulse: 63 63  Resp: 18 18  Temp: 36.8 C 36.6 C  SpO2: 99% 96%    Last Pain:  Vitals:   08/24/17 0624  TempSrc:   PainSc: 1                  Diamone Whistler,  Clearnce Sorrel

## 2017-08-24 NOTE — Evaluation (Signed)
Occupational Therapy Evaluation Patient Details Name: Amanda Holmes MRN: [id number] DOB: 1951/02/01 Today's Date: 08/24/2017    History of Present Illness Pt is 66 year old female s/p L TKA with hx of degenerative arthrosis of the left knee.  She is POD #1 at time of OT evaluation.   Clinical Impression   Pt is 66 year old female s/p L TKA.  Pt was living at home with her husband independent in all ADLs prior to surgery.  She is eager to return to PLOF.  Pt currently requires min assist for LB dressing while sitting upright in bed due to pain and limited AROM of L  knee.  Practice of LB dressing completed in bed due to drain leaking. Pt would benefit from instruction in dressing techniques with or without assistive devices for dressing and bathing skills while in seated position tomorrow.  She is familiar with use of AD since her husband used and has all AD except reacher at home to use for LB dressing since he had both hips replaced. Rec continued OT while in hospital for ADL training with or without AD. No further OT recommended after discharge from hospital.      Follow Up Recommendations  No OT follow up    Equipment Recommendations       Recommendations for Other Services       Precautions / Restrictions Precautions Precautions: Knee;Fall Restrictions Weight Bearing Restrictions: Yes LLE Weight Bearing: Weight bearing as tolerated      Mobility Bed Mobility                  Transfers                      Balance                                           ADL either performed or assessed with clinical judgement   ADL Overall ADL's : Needs assistance/impaired     Grooming: Wash/dry face;Oral care;Applying deodorant;Brushing hair;Independent;Wash/dry hands;Set up   Upper Body Bathing: Independent;Set up   Lower Body Bathing: Set up;Minimal assistance   Upper Body Dressing : Independent;Set up   Lower Body Dressing: Set  up;Minimal assistance;Sit to/from stand;With adaptive equipment                 General ADL Comments: Pt is familiar with AD use since her husband used all of it after recent hip replacements.  Has a sock aid and LH shoe horn but rec buying and using a reacher as well.  Practiced simulation in bed since drain was leaking and NSG aware.       Vision Patient Visual Report: No change from baseline       Perception     Praxis      Pertinent Vitals/Pain Pain Assessment: 0-10 Pain Score: 7  Pain Descriptors / Indicators: Aching;Constant;Discomfort Pain Intervention(s): Limited activity within patient's tolerance;Monitored during session;Premedicated before session;Repositioned     Hand Dominance Right   Extremity/Trunk Assessment Upper Extremity Assessment Upper Extremity Assessment: Overall WFL for tasks assessed   Lower Extremity Assessment Lower Extremity Assessment: Defer to PT evaluation       Communication Communication Communication: No difficulties   Cognition Arousal/Alertness: Awake/alert Behavior During Therapy: WFL for tasks assessed/performed Overall Cognitive Status: Within Functional Limits for tasks assessed  General Comments       Exercises     Shoulder Instructions      Home Living Family/patient expects to be discharged to:: Private residence Living Arrangements: Spouse/significant other Available Help at Discharge: Family Type of Home: House             Bathroom Shower/Tub: Teaching laboratory technician Toilet: Handicapped height Bathroom Accessibility: Yes How Accessible: Accessible via walker Home Equipment: Toilet riser;Grab bars - toilet;Adaptive equipment;Walker - 2 wheels;Bedside commode;Shower seat Adaptive Equipment: Sock aid;Long-handled shoe horn        Prior Functioning/Environment Level of Independence: Independent        Comments: Pt was independent with ADLs prior  to surgery.  She is familiar with AD use since her husband Lanny Hurst had both hips replaced recently.          OT Problem List: Decreased activity tolerance;Pain;Decreased range of motion;Decreased strength      OT Treatment/Interventions: Self-care/ADL training;DME and/or AE instruction;Therapeutic activities;Patient/family education    OT Goals(Current goals can be found in the care plan section) Acute Rehab OT Goals Patient Stated Goal: "to regain independence again" OT Goal Formulation: With patient/family Time For Goal Achievement: 09/07/17 Potential to Achieve Goals: Good ADL Goals Pt Will Perform Lower Body Dressing: with set-up;with supervision;with adaptive equipment;sit to/from stand Pt Will Transfer to Toilet: with supervision;stand pivot transfer;bedside commode (BSC over toilet to simulate higher toilet at home)  OT Frequency: Min 1X/week   Barriers to D/C:            Co-evaluation              AM-PAC PT "6 Clicks" Daily Activity     Outcome Measure Help from another person eating meals?: None Help from another person taking care of personal grooming?: None Help from another person toileting, which includes using toliet, bedpan, or urinal?: A Little Help from another person bathing (including washing, rinsing, drying)?: A Little Help from another person to put on and taking off regular upper body clothing?: None Help from another person to put on and taking off regular lower body clothing?: A Little 6 Click Score: 21   End of Session    Activity Tolerance: Patient tolerated treatment well Patient left: in bed;with call bell/phone within reach;with bed alarm set  OT Visit Diagnosis: Muscle weakness (generalized) (M62.81);Pain Pain - Right/Left: Left Pain - part of body: Knee                Time: 7121-9758 OT Time Calculation (min): 29 min Charges:  OT General Charges $OT Visit: 1 Visit OT Evaluation $OT Eval Low Complexity: 1 Low OT Treatments $Self  Care/Home Management : 8-22 mins G-Codes:     Chrys Racer, OTR/L ascom [phone] 08/24/17, 10:16 AM

## 2017-08-24 NOTE — Evaluation (Signed)
Physical Therapy Evaluation Patient Details Name: Amanda Holmes MRN: [id number] DOB: [birth year] Today's Date: 08/24/2017   History of Present Illness  admitted for acute hospitalization status post L TKR (08/23/17), WBAT.  Clinical Impression  Upon evaluation, patient alert and oriented; follows commands and demonstrates good effort with all therapeutic tasks.  Demonstrates good L LE post-op strength (at least 3-/5) and ROM (0-72 degrees); good L quad activation and control, able to complete SLR with minimal/no quad lag noted.   Currently requiring min assist for bed mobility; min assist for sit/stand, basic transfers and gait (25') with RW.  Decreased stance time/weight acceptance to L LE, but no overt buckling or LOB.  Anticipate good progression towards all mobility goals throughout remaining hospitalization. Would benefit from skilled PT to address above deficits and promote optimal return to PLOF; recommend transition to home with outpatient PT follow up upon discharge from acute hospitalization.    Follow Up Recommendations Outpatient PT    Equipment Recommendations  Rolling walker with 5" wheels;3in1 (PT)    Recommendations for Other Services       Precautions / Restrictions Precautions Precautions: Fall Restrictions Weight Bearing Restrictions: Yes LLE Weight Bearing: Weight bearing as tolerated      Mobility  Bed Mobility Overal bed mobility: Needs Assistance Bed Mobility: Supine to Sit     Supine to sit: Min assist     General bed mobility comments: assist for L LE mangaement over edge of bed  Transfers Overall transfer level: Needs assistance Equipment used: Rolling walker (2 wheeled) Transfers: Sit to/from Stand Sit to Stand: Min assist         General transfer comment: cuing for hand placement; maintains L LE slightly anterior to BOS with movement transition for pain control  Ambulation/Gait Ambulation/Gait assistance: Min guard;Min  assist Ambulation Distance (Feet): 25 Feet Assistive device: Rolling walker (2 wheeled)       General Gait Details: 3-point, step to gait pattern with decreased stance time/weight shift L LE; min cuing for postral extension and L hip extension throughout gait cycle.  Slow and guarded, but good L knee stability/control  Stairs            Wheelchair Mobility    Modified Rankin (Stroke Patients Only)       Balance Overall balance assessment: Needs assistance Sitting-balance support: No upper extremity supported;Feet supported Sitting balance-Leahy Scale: Good     Standing balance support: Bilateral upper extremity supported Standing balance-Leahy Scale: Fair                               Pertinent Vitals/Pain Pain Assessment: 0-10 Pain Score: 8  Pain Location: L knee Pain Descriptors / Indicators: Aching;Grimacing;Operative site guarding Pain Intervention(s): Limited activity within patient's tolerance;Premedicated before session;Monitored during session;Repositioned    Home Living Family/patient expects to be discharged to:: Private residence Living Arrangements: Spouse/significant other Available Help at Discharge: Family Type of Home: House Home Access: Stairs to enter Entrance Stairs-Rails: Right Entrance Stairs-Number of Steps: 2   Home Equipment: Toilet riser;Grab bars - toilet;Adaptive equipment;Walker - 2 wheels;Bedside commode;Shower seat      Prior Function Level of Independence: Independent         Comments: Indep with ADLs, household and community mobilization without assist device; denies fall history.  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     Hand Dominance   Dominant Hand: Right    Extremity/Trunk Assessment  Upper Extremity Assessment Upper Extremity Assessment: Overall WFL for tasks assessed    Lower Extremity Assessment Lower Extremity Assessment:  (L knee grossly 3-/5, limited  by pain; sensation fully intact.  Otherwise, grossly WFL)       Communication   Communication: No difficulties  Cognition Arousal/Alertness: Awake/alert Behavior During Therapy: WFL for tasks assessed/performed Overall Cognitive Status: Within Functional Limits for tasks assessed                                        General Comments      Exercises Total Joint Exercises Goniometric ROM: L knee, 0-72 degrees Other Exercises Other Exercises: Supine L LE therex, 1x10, AROM for muscular strenght/endurance: ankle pumps, quad sets, SAQs, heel slides, hip abduct/adduct and SLR.  Good L quad activation and control; minimal/no lag in L knee with SLR.   Assessment/Plan    PT Assessment Patient needs continued PT services  PT Problem List Decreased strength;Decreased range of motion;Decreased activity tolerance;Decreased mobility;Cardiopulmonary status limiting activity;Decreased cognition;Decreased coordination;Decreased balance;Decreased knowledge of use of DME;Decreased safety awareness;Decreased knowledge of precautions;Obesity;Decreased skin integrity;Pain       PT Treatment Interventions DME instruction;Gait training;Stair training;Functional mobility training;Therapeutic activities;Therapeutic exercise;Balance training;Patient/family education    PT Goals (Current goals can be found in the Care Plan section)  Acute Rehab PT Goals Patient Stated Goal: "to regain independence again" PT Goal Formulation: With patient/family Time For Goal Achievement: 09/07/17 Potential to Achieve Goals: Good    Frequency BID   Barriers to discharge        Co-evaluation               AM-PAC PT "6 Clicks" Daily Activity  Outcome Measure Difficulty turning over in bed (including adjusting bedclothes, sheets and blankets)?: Unable Difficulty moving from lying on back to sitting on the side of the bed? : Unable Difficulty sitting down on and standing up from a chair with  arms (e.g., wheelchair, bedside commode, etc,.)?: Unable Help needed moving to and from a bed to chair (including a wheelchair)?: A Little Help needed walking in hospital room?: A Little Help needed climbing 3-5 steps with a railing? : A Lot 6 Click Score: 11    End of Session Equipment Utilized During Treatment: Gait belt Activity Tolerance: Patient tolerated treatment well Patient left: in chair;with call bell/phone within reach;with chair alarm set;with family/visitor present Nurse Communication: Mobility status PT Visit Diagnosis: Difficulty in walking, not elsewhere classified (R26.2);Muscle weakness (generalized) (M62.81);Pain Pain - Right/Left: Left Pain - part of body: Knee    Time: 1002-1030 PT Time Calculation (min) (ACUTE ONLY): 28 min   Charges:   PT Evaluation $PT Eval Low Complexity: 1 Low PT Treatments $Therapeutic Exercise: 8-22 mins   PT G Codes:   PT G-Codes **NOT FOR INPATIENT CLASS** Functional Assessment Tool Used: AM-PAC 6 Clicks Basic Mobility Functional Limitation: Mobility: Walking and moving around Mobility: Walking and Moving Around Current Status (A1937): At least 40 percent but less than 60 percent impaired, limited or restricted Mobility: Walking and Moving Around Goal Status [phone]): At least 1 percent but less than 20 percent impaired, limited or restricted   Alanis Clift H. Owens Shark, PT, DPT, NCS 08/24/17, 10:42 AM [phone]

## 2017-08-24 NOTE — Progress Notes (Signed)
Clinical Social Worker (CSW) received SNF consult. PT is recommending outpatient PT. RN case manager aware of above. Please reconsult if future social work needs arise. CSW signing off.   Hudsyn Barich, LCSW [phone]  

## 2017-08-24 NOTE — Progress Notes (Signed)
   Subjective: 1 Day Post-Op Procedure(s) (LRB): COMPUTER ASSISTED TOTAL KNEE ARTHROPLASTY (Left) Patient reports pain as 1 on 0-10 scale.  However pain has been between 6 and 8 on the night except for this morning  Patient is well, and has had no acute complaints or problems We will start therapy today.  Plan is to go Home after hospital stay. no nausea and no vomiting Patient denies any chest pains or shortness of breath.  Objective: Vital signs in last 24 hours: Temp:  [96.9 F (36.1 C)-98.8 F (37.1 C)] 97.9 F (36.6 C) (09/13 0500) Pulse Rate:  [63-76] 63 (09/13 0500) Resp:  [16-19] 18 (09/13 0500) BP: (135-153)/(54-75) 146/64 (09/13 0500) SpO2:  [96 %-100 %] 96 % (09/13 0500) Weight:  [113.4 kg (250 lb)] 113.4 kg (250 lb) (09/12 1055) Heels are non tender and elevated off the bed using rolled towels along with bone foam under operative field  Intake/Output from previous day: 09/12 0701 - 09/13 0700 In: 1913.3 [I.V.:1613.3; IV Piggyback:300] Out: 2370 [Urine:2050; Drains:220; Blood:100] Intake/Output this shift: No intake/output data recorded.   Recent Labs  08/23/17 1018 08/24/17 0431  HGB 12.9 12.2    Recent Labs  08/23/17 1018 08/24/17 0431  WBC  --  13.8*  RBC  --  3.95  HCT 38.0 35.5  PLT  --  212    Recent Labs  08/23/17 1018 08/24/17 0431  NA 141 136  K 3.9 3.9  CL  --  104  CO2  --  25  BUN  --  13  CREATININE  --  0.85  GLUCOSE 113* 199*  CALCIUM  --  8.3*   No results for input(s): LABPT, INR in the last 72 hours.  EXAM General - Patient is Alert, Appropriate and Oriented Extremity - Neurologically intact Neurovascular intact Sensation intact distally Intact pulses distally Dorsiflexion/Plantar flexion intact Compartment soft Dressing - dressing C/D/I Motor Function - intact, moving foot and toes well on exam. Able to do straight leg raise on her own  Past Medical History:  Diagnosis Date  . Cancer (HCC)    skin ca squamous  cell  . GERD (gastroesophageal reflux disease)   . Hypertension   . Hypothyroidism   . PONV (postoperative nausea and vomiting)     Assessment/Plan: 1 Day Post-Op Procedure(s) (LRB): COMPUTER ASSISTED TOTAL KNEE ARTHROPLASTY (Left) Active Problems:   S/P total knee arthroplasty  Estimated body mass index is 41.6 kg/m as calculated from the following:   Height as of 08/09/17: 5\' 5"  (1.651 m).   Weight as of this encounter: 113.4 kg (250 lb). Advance diet Up with therapy D/C IV fluids Plan for discharge tomorrow Discharge home with home health  Labs: Were reviewed and acceptable DVT Prophylaxis - Lovenox, Foot Pumps and TED hose Weight-Bearing as tolerated to left leg D/C O2 and Pulse OX and try on Room Air Begin working on bowel movement Labs tomorrow morning  Jon R. Zellwood [place] 08/24/2017, 7:13 AM

## 2017-08-25 LAB — BASIC METABOLIC PANEL
Anion gap: 7 (ref 5–15)
BUN: 12 mg/dL (ref 6–20)
CHLORIDE: 105 mmol/L (ref 101–111)
CO2: 28 mmol/L (ref 22–32)
CREATININE: 0.78 mg/dL (ref 0.44–1.00)
Calcium: 8.5 mg/dL — ABNORMAL LOW (ref 8.9–10.3)
GFR calc non Af Amer: >60 mL/min (ref >60)
Glucose, Bld: 155 mg/dL — ABNORMAL HIGH (ref 65–99)
Potassium: 3.4 mmol/L — ABNORMAL LOW (ref 3.5–5.1)
Sodium: 140 mmol/L (ref 135–145)

## 2017-08-25 LAB — CBC
HEMATOCRIT: 33.7 % — AB (ref 35.0–47.0)
HEMOGLOBIN: 11.5 g/dL — AB (ref 12.0–16.0)
MCH: 30.8 pg (ref 26.0–34.0)
MCHC: 34 g/dL (ref 32.0–36.0)
MCV: 90.6 fL (ref 80.0–100.0)
Platelets: 247 10*3/uL (ref 150–440)
RBC: 3.72 MIL/uL — ABNORMAL LOW (ref 3.80–5.20)
RDW: 15.1 % — ABNORMAL HIGH (ref 11.5–14.5)
WBC: 17.6 10*3/uL — ABNORMAL HIGH (ref 3.6–11.0)

## 2017-08-25 MED ORDER — LACTULOSE 10 GM/15ML PO SOLN
10.0000 g | Freq: Two times a day (BID) | ORAL | Status: DC | PRN
  Administered 2017-08-25: 10 g via ORAL
  Filled 2017-08-25: qty 30

## 2017-08-25 NOTE — Care Management Important Message (Signed)
Important Message  Patient Details  Name: Amanda Holmes MRN: [id number] Date of Birth: 09-09-1951   Medicare Important Message Given:  N/A - LOS <3 / Initial given by admissions    Jolly Mango, RN 08/25/2017, 8:26 AM

## 2017-08-25 NOTE — Care Management Note (Signed)
Case Management Note  Patient Details  Name: Amanda Holmes MRN: [id number] Date of Birth: April 01, 1951  Subjective/Objective: Discharging today                   Action/Plan: Cost of Lovenox is $ 10.00.Patient updated. Reminded patient of OP appointment for PT. Case closed.   Expected Discharge Date:  08/25/17               Expected Discharge Plan:  OP Rehab  In-House Referral:     Discharge planning Services  CM Consult  Post Acute Care Choice:    Choice offered to:  Patient  DME Arranged:    DME Agency:     HH Arranged:    [place] Agency:     Status of Service:  Completed, signed off  If discussed at [employer] of Stay Meetings, dates discussed:    Additional Comments:  Jolly Mango, RN 08/25/2017, 8:19 AM

## 2017-08-25 NOTE — Progress Notes (Signed)
Pt. Had BM after MOM, Lactulose, and warm prune juice. Pt. Is ambulating very well and is ready for discharge. Pt. Walked with PT and did well.

## 2017-08-25 NOTE — Progress Notes (Signed)
Physical Therapy Treatment Patient Details Name: Amanda Holmes MRN: [id number] DOB: 11/23/1951 Today's Date: 08/25/2017    History of Present Illness admitted for acute hospitalization status post L TKR (08/23/17), WBAT.    PT Comments    Able to complete stair negotiation this date, no greater than cga/min assist from therapist.  Returns demonstration of good technique with fair/good L knee control (intermittent guarding from therapist for optimal safety).  Prefers to ascend/descend forward and demonstrates good safety in doing so. No further questions/concerns regarding upcoming discharge; comfortable with current status and level of assist required at home.    Follow Up Recommendations  Outpatient PT     Equipment Recommendations  Rolling walker with 5" wheels;3in1 (PT)    Recommendations for Other Services       Precautions / Restrictions Precautions Precautions: Fall Restrictions Weight Bearing Restrictions: Yes LLE Weight Bearing: Weight bearing as tolerated    Mobility  Bed Mobility               General bed mobility comments: seated in recliner beginning/end of treatment session  Transfers Overall transfer level: Needs assistance Equipment used: Rolling walker (2 wheeled) Transfers: Sit to/from Stand Sit to Stand: Min guard;Supervision         General transfer comment: good carry-over of hand placement, continues to maintain L LE anterior to BOS; poor eccentric control with stand to sit (due to pain in L knee with flexion)  Ambulation/Gait Ambulation/Gait assistance: Min guard;Supervision Ambulation Distance (Feet): 100 Feet (x2) Assistive device: Rolling walker (2 wheeled)       General Gait Details: progressing towards reciprocal stepping pattern with min cuing for L heel strike/toe off; maintains guarded knee positioning with limited knee flexion.  Cautious and guarded, no buckling or LOB   Stairs Stairs: Yes   Stair Management: One rail  Right;Two rails Number of Stairs: 4 General stair comments: up/down 2 with bilat rails, cga; up/down 2 with bilat UEs on R ascending rail, cga.  Step to gait pattern leading with R LE (min cuing for technique); cga for L knee control/stability.  Attempted backwards method; patient prefers forward and demonstrates good understanding.  Wheelchair Mobility    Modified Rankin (Stroke Patients Only)       Balance Overall balance assessment: Needs assistance Sitting-balance support: No upper extremity supported;Feet supported Sitting balance-Leahy Scale: Good     Standing balance support: Bilateral upper extremity supported Standing balance-Leahy Scale: Fair                              Cognition Arousal/Alertness: Awake/alert Behavior During Therapy: WFL for tasks assessed/performed Overall Cognitive Status: Within Functional Limits for tasks assessed                                        Exercises Total Joint Exercises Goniometric ROM: L knee, 0-82 degrees Other Exercises Other Exercises: Seated LE therex, 1x12: ankle pumps, LAQs, L knee flexion (with gentle end-range stretching).  Increased pain with end-range flexion this date. Other Exercises: Verbally reviewed car transfer technique for discharge; patient voiced understanding.    General Comments        Pertinent Vitals/Pain Pain Assessment: 0-10 Pain Score: 4  Pain Location: L knee Pain Descriptors / Indicators: Aching;Grimacing;Operative site guarding Pain Intervention(s): Limited activity within patient's tolerance;Premedicated before session;Repositioned;Monitored during session;Patient requesting pain meds-RN  notified    Home Living                      Prior Function            PT Goals (current goals can now be found in the care plan section) Acute Rehab PT Goals Patient Stated Goal: "to regain independence again" PT Goal Formulation: With patient/family Time For  Goal Achievement: 09/07/17 Potential to Achieve Goals: Good Progress towards PT goals: Progressing toward goals    Frequency    BID      PT Plan Current plan remains appropriate    Co-evaluation              AM-PAC PT "6 Clicks" Daily Activity  Outcome Measure  Difficulty turning over in bed (including adjusting bedclothes, sheets and blankets)?: A Little Difficulty moving from lying on back to sitting on the side of the bed? : A Little Difficulty sitting down on and standing up from a chair with arms (e.g., wheelchair, bedside commode, etc,.)?: Unable Help needed moving to and from a bed to chair (including a wheelchair)?: A Little Help needed walking in hospital room?: A Little Help needed climbing 3-5 steps with a railing? : A Little 6 Click Score: 16    End of Session Equipment Utilized During Treatment: Gait belt Activity Tolerance: Patient tolerated treatment well Patient left: in chair;with call bell/phone within reach;with chair alarm set;with family/visitor present Nurse Communication: Mobility status PT Visit Diagnosis: Difficulty in walking, not elsewhere classified (R26.2);Muscle weakness (generalized) (M62.81);Pain Pain - Right/Left: Left Pain - part of body: Knee     Time: 0998-3382 PT Time Calculation (min) (ACUTE ONLY): 26 min  Charges:  $Gait Training: 8-22 mins $Therapeutic Exercise: 8-22 mins                    G Codes:      Yaritzel Stange H. Owens Shark, PT, DPT, NCS 08/25/17, 9:40 AM [phone]

## 2017-08-25 NOTE — Progress Notes (Signed)
   Subjective: 2 Days Post-Op Procedure(s) (LRB): COMPUTER ASSISTED TOTAL KNEE ARTHROPLASTY (Left) Patient reports pain as moderate.   Patient is well, and has had no acute complaints or problems Pt did well with therapy yesterday. Needs to do steps this am. Plan is to go Home after hospital stay. no nausea and no vomiting Patient denies any chest pains or shortness of breath. Objective: Vital signs in last 24 hours: Temp:  [97.8 F (36.6 C)] 97.8 F (36.6 C) (09/13 1950) Pulse Rate:  [60] 60 (09/13 1950) BP: (140)/(61) 140/61 (09/13 1950) SpO2:  [100 %] 100 % (09/13 1950) well approximated incision Heels are non tender and elevated off the bed using rolled towels Intake/Output from previous day: 09/13 0701 - 09/14 0700 In: -  Out: 125 [Drains:125] Intake/Output this shift: No intake/output data recorded.   Recent Labs  08/23/17 1018 08/24/17 0431 08/25/17 0355  HGB 12.9 12.2 11.5*    Recent Labs  08/24/17 0431 08/25/17 0355  WBC 13.8* 17.6*  RBC 3.95 3.72*  HCT 35.5 33.7*  PLT 212 247    Recent Labs  08/24/17 0431 08/25/17 0355  NA 136 140  K 3.9 3.4*  CL 104 105  CO2 25 28  BUN 13 12  CREATININE 0.85 0.78  GLUCOSE 199* 155*  CALCIUM 8.3* 8.5*   No results for input(s): LABPT, INR in the last 72 hours.  EXAM General - Patient is Alert, Appropriate and Oriented Extremity - Neurologically intact Neurovascular intact Sensation intact distally Intact pulses distally Dorsiflexion/Plantar flexion intact No cellulitis present Compartment soft Dressing - dressing C/D/I Motor Function - intact, moving foot and toes well on exam.    Past Medical History:  Diagnosis Date  . Cancer (HCC)    skin ca squamous cell  . GERD (gastroesophageal reflux disease)   . Hypertension   . Hypothyroidism   . PONV (postoperative nausea and vomiting)     Assessment/Plan: 2 Days Post-Op Procedure(s) (LRB): COMPUTER ASSISTED TOTAL KNEE ARTHROPLASTY (Left) Active  Problems:   S/P total knee arthroplasty  Estimated body mass index is 41.6 kg/m as calculated from the following:   Height as of 08/09/17: 5\' 5"  (1.651 m).   Weight as of this encounter: 113.4 kg (250 lb). Up with therapy Discharge home with home health  Labs: reviewed DVT Prophylaxis - Lovenox, Foot Pumps and TED hose Weight-Bearing as tolerated to left leg hemvac discontinued Please wash operative leg and apply TED stockings. Please change dressing prior to patient being discharged. Please give the patient 2 extra honeycomb dressings to take home. Patient needs a bowel movement prior to being discharged and do steps  Nira Visscher R. [place] Page 08/25/2017, 7:01 AM

## 2017-08-25 NOTE — Care Management (Signed)
Ordered walker from  [facility].

## 2017-11-21 ENCOUNTER — Other Ambulatory Visit: Payer: Self-pay | Admitting: Internal Medicine

## 2017-11-21 DIAGNOSIS — Z1231 Encounter for screening mammogram for malignant neoplasm of breast: Secondary | ICD-10-CM

## 2017-12-18 ENCOUNTER — Ambulatory Visit
Admission: RE | Admit: 2017-12-18 | Discharge: 2017-12-18 | Disposition: A | Discharge status: Home / Self Care | Payer: Managed Care, Other (non HMO) | Source: Ambulatory Visit | Attending: Internal Medicine | Admitting: Internal Medicine

## 2017-12-18 DIAGNOSIS — Z1231 Encounter for screening mammogram for malignant neoplasm of breast: Secondary | ICD-10-CM | POA: Insufficient documentation

## 2018-10-28 IMAGING — DX DG KNEE 1-2V PORT*L*
2 series · 2 of 2 positions shown · non-contrast
Comparison: None.

CLINICAL DATA: 65-year-old female status post left knee
replacement.

EXAM:
PORTABLE LEFT KNEE - 1-2 VIEW

[knee ap]
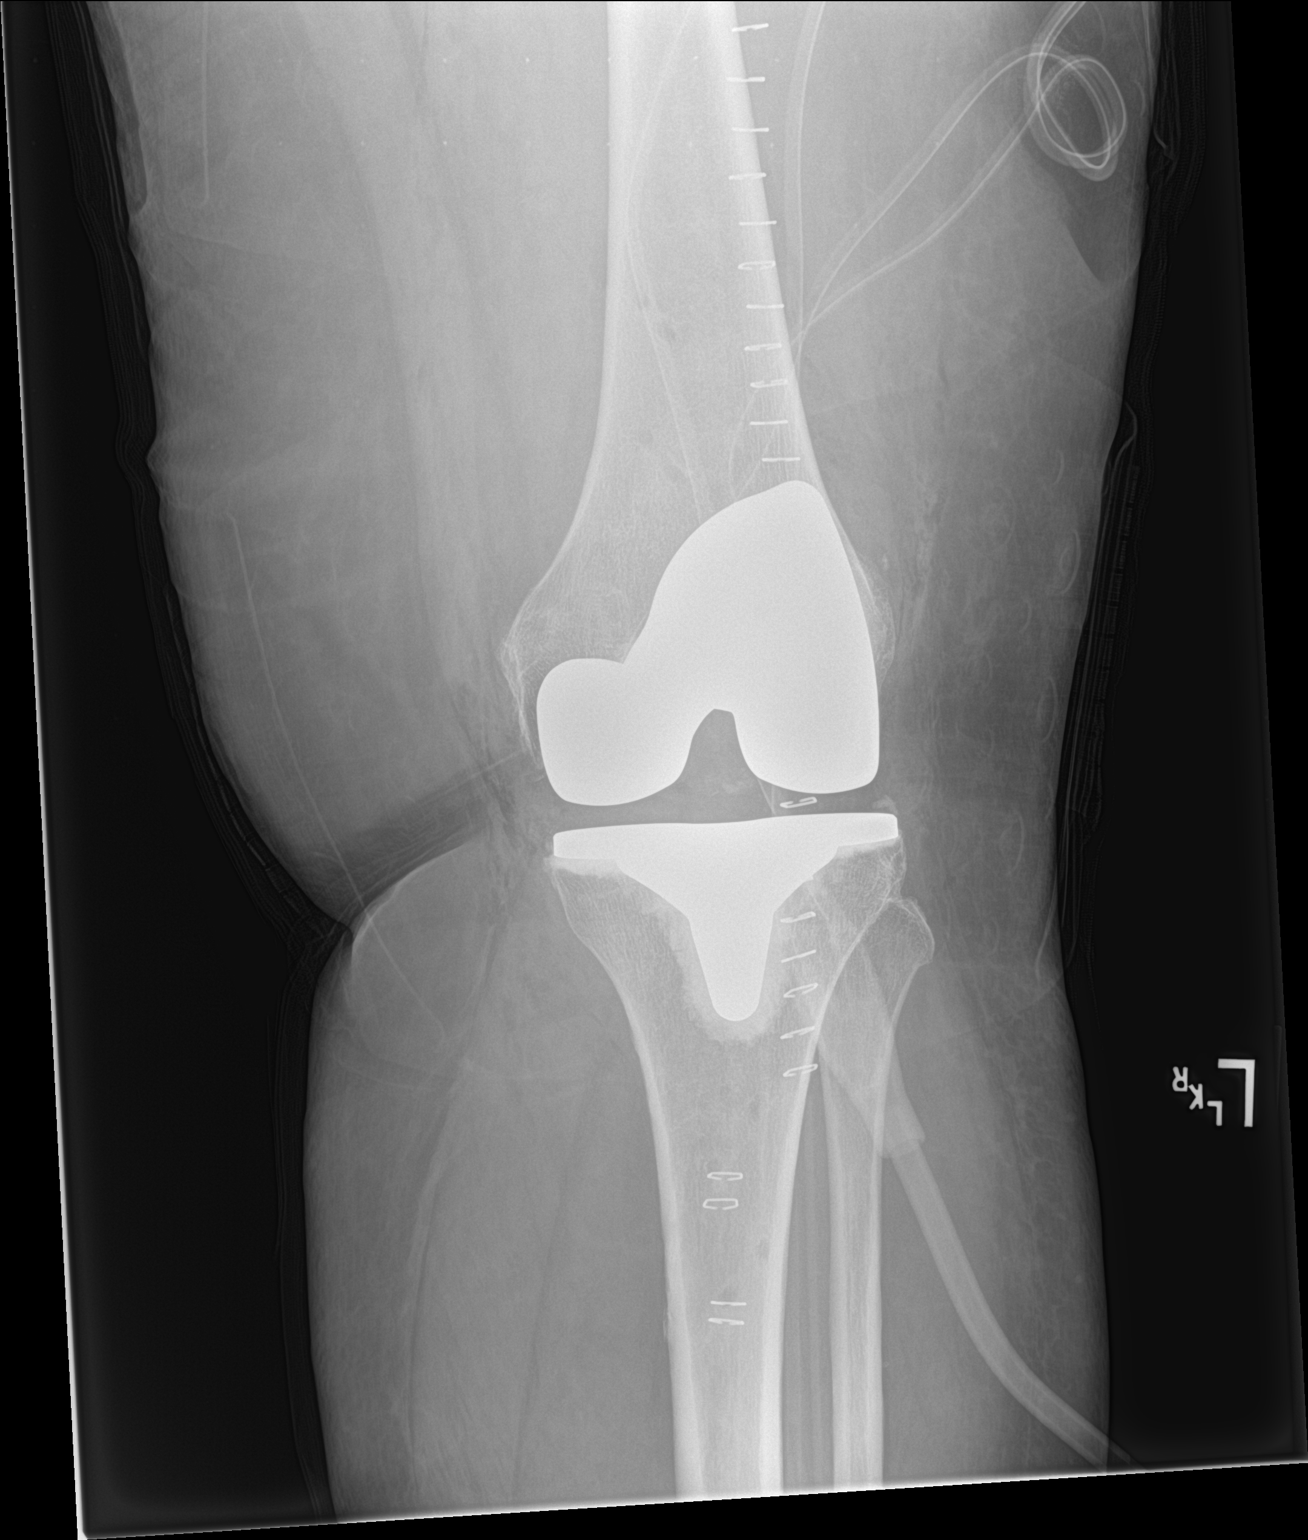

[knee lat]
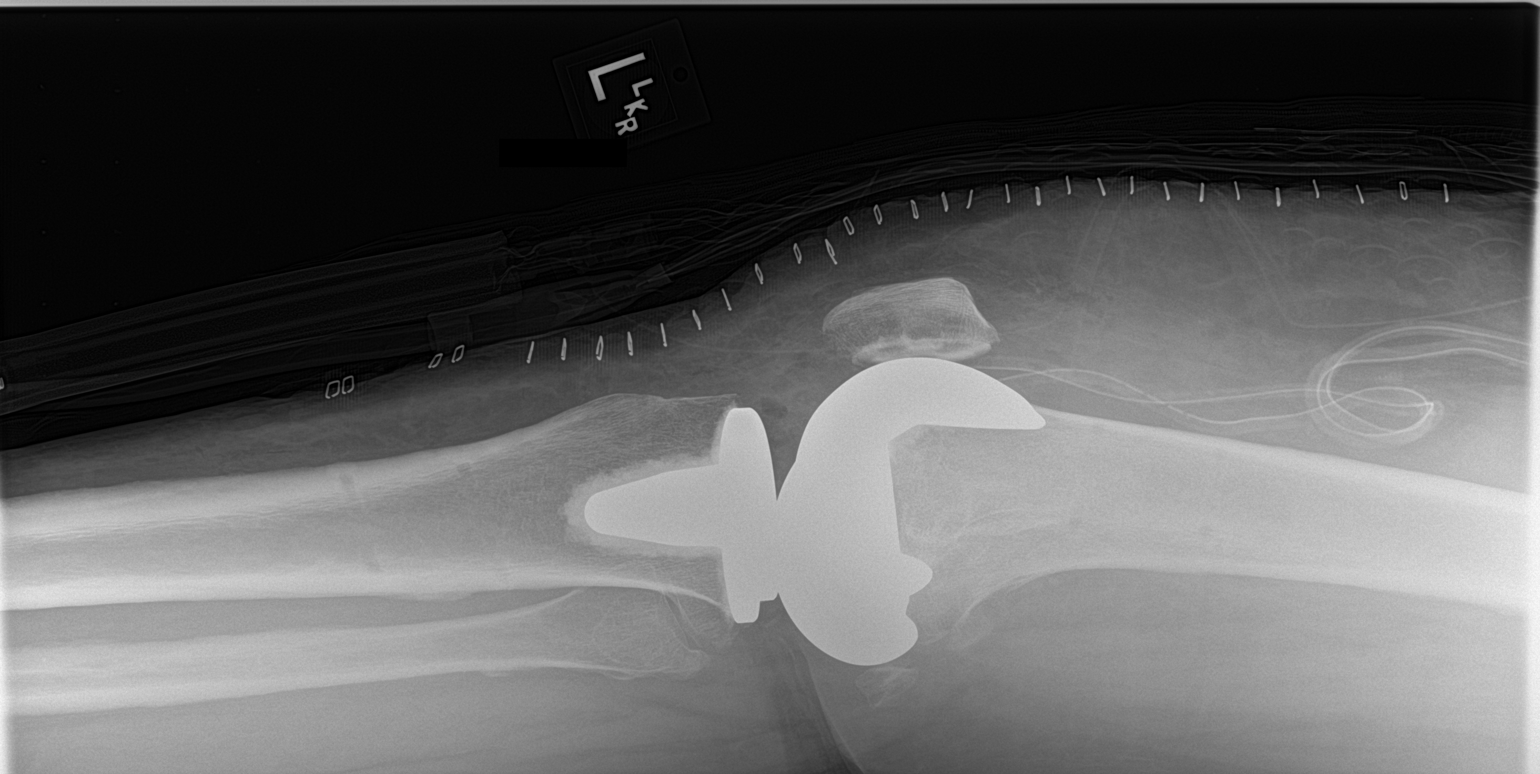

[2 of 2 positions shown; findings below may reference images not displayed]

FINDINGS: AP and cross-table lateral views of the left knee. Sequelae of left
total knee arthroplasty. Two postoperative drains are in place.
Hardware appears intact and normally aligned. Surrounding soft
tissue postoperative changes, including anterior skin staples.
IMPRESSION: Left total knee arthroplasty with no adverse features.

## 2019-01-11 ENCOUNTER — Other Ambulatory Visit: Payer: Self-pay | Admitting: Internal Medicine

## 2019-01-11 DIAGNOSIS — Z1231 Encounter for screening mammogram for malignant neoplasm of breast: Secondary | ICD-10-CM

## 2019-01-14 ENCOUNTER — Other Ambulatory Visit: Payer: Self-pay | Admitting: Internal Medicine

## 2019-01-14 DIAGNOSIS — N644 Mastodynia: Secondary | ICD-10-CM

## 2019-01-15 ENCOUNTER — Other Ambulatory Visit: Payer: Self-pay | Admitting: Internal Medicine

## 2019-01-15 DIAGNOSIS — N644 Mastodynia: Secondary | ICD-10-CM

## 2019-01-21 ENCOUNTER — Ambulatory Visit
Admission: RE | Admit: 2019-01-21 | Discharge: 2019-01-21 | Disposition: A | Discharge status: Home / Self Care | Payer: Medicare HMO | Source: Ambulatory Visit | Attending: Internal Medicine | Admitting: Internal Medicine

## 2019-01-21 DIAGNOSIS — N644 Mastodynia: Secondary | ICD-10-CM

## 2019-10-20 ENCOUNTER — Emergency Department: Payer: Medicare HMO

## 2019-10-20 ENCOUNTER — Inpatient Hospital Stay
Admission: EM | Admit: 2019-10-20 | Discharge: 2019-10-22 | DRG: 310 | Disposition: A | Discharge status: Home / Self Care | Payer: Medicare HMO | Attending: Internal Medicine | Admitting: Internal Medicine

## 2019-10-20 ENCOUNTER — Other Ambulatory Visit: Payer: Self-pay

## 2019-10-20 DIAGNOSIS — K219 Gastro-esophageal reflux disease without esophagitis: Secondary | ICD-10-CM | POA: Diagnosis present

## 2019-10-20 DIAGNOSIS — E039 Hypothyroidism, unspecified: Secondary | ICD-10-CM

## 2019-10-20 DIAGNOSIS — Z888 Allergy status to other drugs, medicaments and biological substances status: Secondary | ICD-10-CM | POA: Diagnosis not present

## 2019-10-20 DIAGNOSIS — I10 Essential (primary) hypertension: Secondary | ICD-10-CM | POA: Diagnosis present

## 2019-10-20 DIAGNOSIS — E890 Postprocedural hypothyroidism: Secondary | ICD-10-CM | POA: Diagnosis present

## 2019-10-20 DIAGNOSIS — R0609 Other forms of dyspnea: Secondary | ICD-10-CM

## 2019-10-20 DIAGNOSIS — Z96652 Presence of left artificial knee joint: Secondary | ICD-10-CM | POA: Diagnosis present

## 2019-10-20 DIAGNOSIS — Z20828 Contact with and (suspected) exposure to other viral communicable diseases: Secondary | ICD-10-CM | POA: Diagnosis present

## 2019-10-20 DIAGNOSIS — Z79890 Hormone replacement therapy (postmenopausal): Secondary | ICD-10-CM

## 2019-10-20 DIAGNOSIS — Z85828 Personal history of other malignant neoplasm of skin: Secondary | ICD-10-CM | POA: Diagnosis not present

## 2019-10-20 DIAGNOSIS — Z803 Family history of malignant neoplasm of breast: Secondary | ICD-10-CM | POA: Diagnosis not present

## 2019-10-20 DIAGNOSIS — R002 Palpitations: Secondary | ICD-10-CM

## 2019-10-20 DIAGNOSIS — E876 Hypokalemia: Secondary | ICD-10-CM | POA: Diagnosis present

## 2019-10-20 DIAGNOSIS — I4891 Unspecified atrial fibrillation: Secondary | ICD-10-CM | POA: Diagnosis present

## 2019-10-20 DIAGNOSIS — R079 Chest pain, unspecified: Secondary | ICD-10-CM | POA: Diagnosis not present

## 2019-10-20 LAB — BRAIN NATRIURETIC PEPTIDE: B Natriuretic Peptide: 108 pg/mL — ABNORMAL HIGH (ref 0.0–100.0)

## 2019-10-20 LAB — FIBRIN DERIVATIVES D-DIMER (ARMC ONLY): Fibrin derivatives D-dimer (ARMC): 316.8 ng/mL (FEU) (ref 0.00–499.00)

## 2019-10-20 LAB — PROTIME-INR
INR: 1 (ref 0.8–1.2)
Prothrombin Time: 13.3 seconds (ref 11.4–15.2)

## 2019-10-20 LAB — TROPONIN I (HIGH SENSITIVITY): Troponin I (High Sensitivity): 7 ng/L (ref <18)

## 2019-10-20 LAB — BASIC METABOLIC PANEL
Anion gap: 13 (ref 5–15)
BUN: 16 mg/dL (ref 8–23)
CO2: 25 mmol/L (ref 22–32)
Calcium: 9.3 mg/dL (ref 8.9–10.3)
Chloride: 105 mmol/L (ref 98–111)
Creatinine, Ser: 0.9 mg/dL (ref 0.44–1.00)
GFR calc Af Amer: >60 mL/min (ref >60)
GFR calc non Af Amer: >60 mL/min (ref >60)
Glucose, Bld: 186 mg/dL — ABNORMAL HIGH (ref 70–99)
Potassium: 3.4 mmol/L — ABNORMAL LOW (ref 3.5–5.1)
Sodium: 143 mmol/L (ref 135–145)

## 2019-10-20 LAB — PHOSPHORUS: Phosphorus: 2.3 mg/dL — ABNORMAL LOW (ref 2.5–4.6)

## 2019-10-20 LAB — MAGNESIUM: Magnesium: 2.1 mg/dL (ref 1.7–2.4)

## 2019-10-20 LAB — CBC
HCT: 39.9 % (ref 36.0–46.0)
Hemoglobin: 12.9 g/dL (ref 12.0–15.0)
MCH: 29.1 pg (ref 26.0–34.0)
MCHC: 32.3 g/dL (ref 30.0–36.0)
MCV: 90.1 fL (ref 80.0–100.0)
Platelets: 274 10*3/uL (ref 150–400)
RBC: 4.43 MIL/uL (ref 3.87–5.11)
RDW: 13.3 % (ref 11.5–15.5)
WBC: 12.3 10*3/uL — ABNORMAL HIGH (ref 4.0–10.5)
nRBC: 0 % (ref 0.0–0.2)

## 2019-10-20 LAB — TSH: TSH: 0.126 u[IU]/mL — ABNORMAL LOW (ref 0.350–4.500)

## 2019-10-20 MED ORDER — FAMOTIDINE 20 MG PO TABS
20.0000 mg | ORAL_TABLET | Freq: Every day | ORAL | Status: DC
  Administered 2019-10-21 (×2): 20 mg via ORAL
  Filled 2019-10-20 (×3): qty 1

## 2019-10-20 MED ORDER — DILTIAZEM HCL 25 MG/5ML IV SOLN
15.0000 mg | Freq: Once | INTRAVENOUS | Status: AC
  Administered 2019-10-20: 20:00:00 15 mg via INTRAVENOUS
  Filled 2019-10-20: qty 5

## 2019-10-20 MED ORDER — TRAMADOL HCL 50 MG PO TABS: 50.0000 mg | ORAL_TABLET | Freq: Four times a day (QID) | ORAL | Status: DC | PRN

## 2019-10-20 MED ORDER — HYDROCHLOROTHIAZIDE 12.5 MG PO CAPS
12.5000 mg | ORAL_CAPSULE | Freq: Every day | ORAL | Status: DC
  Administered 2019-10-21 – 2019-10-22 (×2): 12.5 mg via ORAL
  Filled 2019-10-20 (×2): qty 1

## 2019-10-20 MED ORDER — RIVAROXABAN 10 MG PO TABS
20.0000 mg | ORAL_TABLET | Freq: Every day | ORAL | Status: DC
  Filled 2019-10-20: qty 2

## 2019-10-20 MED ORDER — SODIUM CHLORIDE 0.9 % IV BOLUS
1000.0000 mL | Freq: Once | INTRAVENOUS | Status: AC
  Administered 2019-10-20: 20:00:00 1000 mL via INTRAVENOUS

## 2019-10-20 MED ORDER — LEVOTHYROXINE SODIUM 100 MCG PO TABS
200.0000 ug | ORAL_TABLET | Freq: Every day | ORAL | Status: DC
  Administered 2019-10-21 – 2019-10-22 (×2): 200 ug via ORAL
  Filled 2019-10-20 (×2): qty 2

## 2019-10-20 MED ORDER — DILTIAZEM HCL 30 MG PO TABS
30.0000 mg | ORAL_TABLET | Freq: Four times a day (QID) | ORAL | Status: DC
  Administered 2019-10-21 – 2019-10-22 (×6): 30 mg via ORAL
  Filled 2019-10-20 (×6): qty 1

## 2019-10-20 MED ORDER — ONDANSETRON HCL 4 MG/2ML IJ SOLN: 4.0000 mg | Freq: Four times a day (QID) | INTRAMUSCULAR | Status: DC | PRN

## 2019-10-20 MED ORDER — OXYCODONE HCL 5 MG PO TABS: 5.0000 mg | ORAL_TABLET | ORAL | Status: DC | PRN

## 2019-10-20 MED ORDER — SODIUM CHLORIDE 0.9% FLUSH: 3.0000 mL | INTRAVENOUS | Status: DC | PRN

## 2019-10-20 MED ORDER — SODIUM CHLORIDE 0.9% FLUSH
3.0000 mL | Freq: Two times a day (BID) | INTRAVENOUS | Status: DC
  Administered 2019-10-21 (×2): 3 mL via INTRAVENOUS

## 2019-10-20 MED ORDER — ZOLPIDEM TARTRATE 5 MG PO TABS: 5.0000 mg | ORAL_TABLET | Freq: Every evening | ORAL | Status: DC | PRN

## 2019-10-20 MED ORDER — POTASSIUM CHLORIDE CRYS ER 20 MEQ PO TBCR
10.0000 meq | EXTENDED_RELEASE_TABLET | Freq: Every day | ORAL | Status: DC
  Administered 2019-10-21 (×2): 10 meq via ORAL
  Filled 2019-10-20 (×4): qty 1

## 2019-10-20 MED ORDER — POTASSIUM CHLORIDE 20 MEQ PO PACK
40.0000 meq | PACK | Freq: Once | ORAL | Status: AC
  Administered 2019-10-21: 01:00:00 40 meq via ORAL
  Filled 2019-10-20: qty 2

## 2019-10-20 MED ORDER — RIVAROXABAN 20 MG PO TABS
20.0000 mg | ORAL_TABLET | Freq: Every day | ORAL | Status: DC
  Administered 2019-10-21 (×2): 20 mg via ORAL
  Filled 2019-10-20 (×3): qty 1

## 2019-10-20 MED ORDER — LOSARTAN POTASSIUM 50 MG PO TABS
100.0000 mg | ORAL_TABLET | Freq: Every day | ORAL | Status: DC
  Administered 2019-10-21 – 2019-10-22 (×2): 100 mg via ORAL
  Filled 2019-10-20 (×2): qty 2

## 2019-10-20 MED ORDER — LOSARTAN POTASSIUM-HCTZ 100-12.5 MG PO TABS: 1.0000 | ORAL_TABLET | Freq: Every day | ORAL | Status: DC

## 2019-10-20 MED ORDER — SODIUM CHLORIDE 0.9 % IV SOLN: 250.0000 mL | INTRAVENOUS | Status: DC | PRN

## 2019-10-20 MED ORDER — LORATADINE 10 MG PO TABS
10.0000 mg | ORAL_TABLET | Freq: Every day | ORAL | Status: DC
  Administered 2019-10-21 – 2019-10-22 (×3): 10 mg via ORAL
  Filled 2019-10-20 (×3): qty 1

## 2019-10-20 MED ORDER — ACETAMINOPHEN 325 MG PO TABS
650.0000 mg | ORAL_TABLET | ORAL | Status: DC | PRN
  Administered 2019-10-21 – 2019-10-22 (×2): 650 mg via ORAL
  Filled 2019-10-20 (×2): qty 2

## 2019-10-20 NOTE — ED Notes (Signed)
First Nurse Note: Pt ambulatory into ED without difficulty stating that she was seen at [facility] for chest pain and was advised to come to the ED for evaluation. Pt is in NAD at this time.

## 2019-10-20 NOTE — ED Triage Notes (Signed)
Pt arrived via POV with reports of heart racing, central chest pain, radiating to jaw and left arm.  Pt states she had recent 5lb weight gain in over a few days, pt states she has also had nights where she is unable to breathe when laying flat.  Pt states pain is minimal at this time.  Pt has no cardiac hx.

## 2019-10-20 NOTE — H&P (Signed)
[facility] [place] NAME: Amanda Holmes    MR#:  [id number]  DATE OF BIRTH:  01/15/1951  DATE OF ADMISSION:  10/20/2019  PRIMARY CARE PHYSICIAN: Adin Hector, MD   REQUESTING/REFERRING PHYSICIAN: Brenton Grills, MD CHIEF COMPLAINT:   Chief Complaint  Patient presents with  . Chest Pain  . Tachycardia    HISTORY OF PRESENT ILLNESS:  Amanda Holmes  is a 68 y.o. Caucasian female with a known history of hypertension and hypothyroidism, who presented to the emergency room with acute onset of palpitations with associated chest pain felt like fluttering and tightness and graded 2-3/10 in severity with radiation to her left arm as well as her jaw.  She denies any nausea vomiting or diaphoresis.  She has been having dyspnea with associated orthopnea and paroxysmal nocturnal dyspnea and lower extremity edema with dyspnea on exertion which have been going on over the last month.  No dysuria, oliguria or hematuria or flank pain.  No fever or chills or cough or hemoptysis.  No recent sick exposures.  Upon presentation to the emergency room, blood pressure was 135/65 with heart rate of 121 respiratory to 24 and pulse currently 96% on room air.  Labs revealed potassium of 3.4 and magnesium of 2.1 with blood glucose of 186 and CBC with WBC of 12.3.  Fibrin derivatives D-dimer was 316.8 and INR was 1 with PT of 13.3.  TSH was 0.126 and her  COVID-19 test is currently pending.  For which x-ray showed no acute cardiopulmonary disease.  EKG showed atrial fibrillation with rapid ventricular spots of 123.  The patient was given f 15 g of IV Cardizem with slowing down of the heart rate to 70 and 1 L bolus of IV normal saline.  During the interview heart rate was going up to 129 and came down to low 100s.  She will be admitted to a telemetry bed for further evaluation and management.  PAST MEDICAL HISTORY:   Past Medical History:  Diagnosis Date  . Cancer (HCC)    skin  ca squamous cell  . GERD (gastroesophageal reflux disease)   . Hypertension   . Hypothyroidism   . PONV (postoperative nausea and vomiting)     PAST SURGICAL HISTORY:   Past Surgical History:  Procedure Laterality Date  . DILATION AND CURETTAGE OF UTERUS N/A 09/23/2016   Procedure: DILATATION AND CURETTAGE AND HYSTEROSCOPY;  Surgeon: Benjaman Kindler, MD;  Location: [facility] ORS;  Service: Gynecology;  Laterality: N/A;  . GANGLION CYST EXCISION    . KNEE ARTHROPLASTY Left 08/23/2017   Procedure: COMPUTER ASSISTED TOTAL KNEE ARTHROPLASTY;  Surgeon: Dereck Leep, MD;  Location: [facility] ORS;  Service: Orthopedics;  Laterality: Left;  . TUBAL LIGATION      SOCIAL HISTORY:   Social History   Tobacco Use  . Smoking status: Never Smoker  . Smokeless tobacco: Never Used  Substance Use Topics  . Alcohol use: No    FAMILY HISTORY:   Family History  Problem Relation Age of Onset  . Breast cancer Sister 33    DRUG ALLERGIES:   Allergies  Allergen Reactions  . Celebrex [Celecoxib] Anaphylaxis and Shortness Of Breath    REVIEW OF SYSTEMS:   ROS As per history of present illness. All pertinent systems were reviewed above. Constitutional,  HEENT, cardiovascular, respiratory, GI, GU, musculoskeletal, neuro, psychiatric, endocrine,  integumentary and hematologic systems were reviewed and are otherwise  negative/unremarkable except for positive findings mentioned above  in the HPI.   MEDICATIONS AT HOME:   Prior to Admission medications   Medication Sig Start Date End Date Taking? Authorizing Provider  acetaminophen (TYLENOL) 500 MG tablet Take 500-1,000 mg by mouth 2 (two) times daily. 2 tabs in the am, 1 tab at bedtime   Yes [provider]  azelastine (ASTELIN) 0.1 % nasal spray Place 1 spray into both nostrils 2 (two) times daily. Use in each nostril as directed   Yes [provider]  DULoxetine (CYMBALTA) 30 MG capsule Take 30 mg by mouth daily.   Yes [provider]  Estradiol-Norethindrone Acet (AMABELZ) 0.5-0.1 MG tablet Take 1 tablet by mouth daily.   Yes [provider]  hydrochlorothiazide (HYDRODIURIL) 12.5 MG tablet Take 12.5 mg by mouth daily. 10/12/19  Yes [provider]  hydrOXYzine (ATARAX/VISTARIL) 10 MG tablet Take 1-2 tablets by mouth 3 (three) times daily as needed. 10/18/19  Yes [provider]  levothyroxine (SYNTHROID, LEVOTHROID) 200 MCG tablet Take 200 mcg by mouth daily before breakfast.   Yes [provider]  loratadine (CLARITIN) 10 MG tablet Take 10 mg by mouth daily.   Yes [provider]  losartan (COZAAR) 100 MG tablet Take 100 mg by mouth daily. 10/12/19  Yes [provider]  nystatin cream (MYCOSTATIN) Apply 1 application topically 2 (two) times daily.   Yes [provider]  pantoprazole (PROTONIX) 40 MG tablet Take 40 mg by mouth 2 (two) times daily.   Yes [provider]  predniSONE (DELTASONE) 10 MG tablet Take 10 mg by mouth 2 (two) times daily. 10/18/19  Yes [provider]  traMADol (ULTRAM) 50 MG tablet Take 1 tablet by mouth 2 (two) times daily as needed. 04/10/19 04/09/20 Yes [provider]      VITAL SIGNS:  Blood pressure (!) 162/94, pulse (!) 43, temperature 98.6 F (37 C), temperature source Oral, resp. rate 18, height 5\' 5"  (1.651 m), weight 113.9 kg, SpO2 97 %.  PHYSICAL EXAMINATION:  Physical Exam  GENERAL:  68 y.o.-year-old Caucasian female patient lying in the bed with no acute distress.  EYES: Pupils equal, round, reactive to light and accommodation. No scleral icterus. Extraocular muscles intact.  HEENT: Head atraumatic, normocephalic. Oropharynx and nasopharynx clear.  NECK:  Supple, no jugular venous distention. No thyroid enlargement, no tenderness.  LUNGS: Normal breath sounds bilaterally, no wheezing, rales,rhonchi or crepitation. No use of accessory muscles of respiration.  CARDIOVASCULAR:  Irregularly irregular tachycardic rhythm, S1, S2 normal. No murmurs, rubs, or gallops.  ABDOMEN: Soft, nondistended, nontender. Bowel sounds present. No organomegaly or mass.  EXTREMITIES: Trace bilateral lower extremity pitting edema, with no cyanosis, or clubbing.  NEUROLOGIC: Cranial nerves II through XII are intact. Muscle strength 5/5 in all extremities. Sensation intact. Gait not checked.  PSYCHIATRIC: The patient is alert and oriented x 3.  Normal affect and good eye contact. SKIN: No obvious rash, lesion, or ulcer.   LABORATORY PANEL:   CBC Recent Labs  Lab 10/20/19 1926  WBC 12.3*  HGB 12.9  HCT 39.9  PLT 274   ------------------------------------------------------------------------------------------------------------------  Chemistries  Recent Labs  Lab 10/20/19 1926  NA 143  K 3.4*  CL 105  CO2 25  GLUCOSE 186*  BUN 16  CREATININE 0.90  CALCIUM 9.3  MG 2.1   ------------------------------------------------------------------------------------------------------------------  Cardiac Enzymes No results for input(s): TROPONINI in the last 168 hours. ------------------------------------------------------------------------------------------------------------------  RADIOLOGY:  Dg Chest Portable 1 View  Result Date: 10/20/2019 CLINICAL DATA:  Chest pain  and tachycardia EXAM: PORTABLE CHEST 1 VIEW COMPARISON:  None. FINDINGS: Cardiac shadow is within normal limits. Aortic calcifications are seen. The lungs are well aerated bilaterally. No focal infiltrate or sizable effusion is noted. No acute bony abnormality is noted. IMPRESSION: No active disease. Electronically Signed   By: Inez Catalina M.D.   On: 10/20/2019 19:34      IMPRESSION AND PLAN:   1.  New onset atrial fibrillation with right ventricular response.  The patient will be admitted to a telemetry bed.  We will continue him on p.o. Cardizem with holding parameters.  We will utilize IV Cardizem drip if need  be.  She will be hydrated with IV normal saline.  We will obtain a 2D echo and cardiology consultation in a.m.  I notified Dr. Percival Spanish about the patient.  The patient's [place] 2 vascular score is 2.  She will be placed on p.o. Xarelto.  D-dimer came back normal.  2.  Hypokalemia.  This could be contributing to #1.  Potassium will be replaced.  Magnesium is normal.  3.  Hypertension.  We will continue HCTZ and monitor potassium level.  4.  Hypothyroidism.  We will continue Synthroid and check free T3 and free T4 given low TSH level which could be related to hormonal therapy.  5.  GERD.  The patient will be placed on continued PPI therapy.  6.  DVT prophylaxis.  The patient will be placed on Xarelto.  All the records are reviewed and case discussed with ED provider. The plan of care was discussed in details with the patient (and family). I answered all questions. The patient agreed to proceed with the above mentioned plan. Further management will depend upon hospital course.   CODE STATUS: Full code  TOTAL TIME TAKING CARE OF THIS PATIENT: 55 minutes.    Christel Mormon M.D on 10/20/2019 at 9:31 PM  [facility]   From 7 PM-7 AM, contact night-coverage [email or web address]  CC: Primary care physician; Adin Hector, MD   Note: This dictation was prepared with Dragon dictation along with smaller phrase technology. Any transcriptional errors that result from this process are unintentional.

## 2019-10-20 NOTE — ED Notes (Signed)
Dr. Stafford at bedside.  

## 2019-10-20 NOTE — ED Provider Notes (Signed)
[facility] Emergency Department Provider Note  ____________________________________________  Time seen: Approximately 7:17 PM  I have reviewed the triage vital signs and the nursing notes.   HISTORY  Chief Complaint Chest Pain and Tachycardia    HPI Amanda Holmes is a 68 y.o. female with a history of GERD, hypertension, hypothyroidism who comes to the ED complaining of palpitations chest pain and shortness of breath that started today at 4:00 PM.  It was constant until about 6:00 PM at which time her chest pain resolved.  She still feels short of breath and palpitations.  With this she feels weak and has a generalized headache.  Denies dizziness or syncope.  Chest pain was constant, no aggravating or alleviating factors, central chest radiating to the left arm.  She reports that for the past few months she has been having some peripheral edema and orthopnea, having to sleep upright in a recliner.  She also gets dyspnea on exertion.  She is more comfortable sitting upright and laying down.  She is gained 5 pounds over the last few days.  Her nighttime symptoms also include upper chest and jaw pain.  Denies any known cardiac history, does not have a cardiologist.  No prior cardiac work-up.      Past Medical History:  Diagnosis Date  . Cancer (HCC)    skin ca squamous cell  . GERD (gastroesophageal reflux disease)   . Hypertension   . Hypothyroidism   . PONV (postoperative nausea and vomiting)      Patient Active Problem List   Diagnosis Date Noted  . 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 DILATION AND CURETTAGE OF UTERUS N/A 09/23/2016   Procedure: DILATATION AND CURETTAGE AND HYSTEROSCOPY;  Surgeon: Benjaman Kindler, MD;  Location: [facility] ORS;  Service: Gynecology;  Laterality: N/A;  . GANGLION CYST EXCISION    . KNEE ARTHROPLASTY Left 08/23/2017   Procedure: COMPUTER ASSISTED TOTAL KNEE  ARTHROPLASTY;  Surgeon: Dereck Leep, MD;  Location: [facility] ORS;  Service: Orthopedics;  Laterality: Left;  . TUBAL LIGATION       Prior to Admission medications   Medication Sig Start Date End Date Taking? Authorizing Provider  acetaminophen (TYLENOL) 500 MG tablet Take 500-1,000 mg by mouth 2 (two) times daily. 2 tabs in the am, 1 tab at bedtime    [provider]  enoxaparin (LOVENOX) 30 MG/0.3ML injection Inject 0.4 mLs (40 mg total) into the skin daily. 08/24/17   Watt Climes, PA  Estradiol-Norethindrone Acet (AMABELZ) 0.5-0.1 MG tablet Take 1 tablet by mouth daily.    [provider]  levothyroxine (SYNTHROID, LEVOTHROID) 200 MCG tablet Take 200 mcg by mouth daily before breakfast.    [provider]  loratadine (CLARITIN) 10 MG tablet Take 10 mg by mouth daily.    [provider]  losartan-hydrochlorothiazide (HYZAAR) 100-12.5 MG tablet Take 1 tablet by mouth daily.    [provider]  oxyCODONE (OXY IR/ROXICODONE) 5 MG immediate release tablet Take 1-2 tablets (5-10 mg total) by mouth every 4 (four) hours as needed for severe pain or breakthrough pain. 08/24/17   Watt Climes, PA  potassium chloride (K-DUR) 10 MEQ tablet Take 10 mEq by mouth daily. 08/10/17 08/10/18  [provider]  ranitidine (ZANTAC) 150 MG tablet Take 150 mg by mouth daily as needed for heartburn.    [provider]  traMADol (ULTRAM) 50 MG tablet Take 1 tablet (  50 mg total) by mouth every 4 (four) hours as needed for moderate pain or severe pain. 08/24/17   Watt Climes, PA     Allergies Celebrex [celecoxib]   Family History  Problem Relation Age of Onset  . Breast cancer Sister 61    Social History Social History   Tobacco Use  . Smoking status: Never Smoker  . Smokeless tobacco: Never Used  Substance Use Topics  . Alcohol use: No  . Drug use: No    Review of Systems  Constitutional:   No fever or chills.  ENT:   No sore throat. No  rhinorrhea. Cardiovascular: Positive chest pain and palpitations as above without syncope. Respiratory:   Positive shortness of breath without cough. Gastrointestinal:   Negative for abdominal pain, vomiting and diarrhea.  Musculoskeletal: Positive for bilateral lower leg swelling All other systems reviewed and are negative except as documented above in ROS and HPI.  ____________________________________________   PHYSICAL EXAM:  VITAL SIGNS: ED Triage Vitals  Enc Vitals Group     BP 10/20/19 1847 135/65     Pulse Rate 10/20/19 1847 (!) 121     Resp 10/20/19 1847 (!) 24     Temp 10/20/19 1847 98.6 F (37 C)     Temp Source 10/20/19 1847 Oral     SpO2 10/20/19 1847 96 %     Weight 10/20/19 1846 251 lb (113.9 kg)     Height 10/20/19 1846 5\' 5"  (1.651 m)     Head Circumference --      Peak Flow --      Pain Score 10/20/19 1846 3     Pain Loc --      Pain Edu? --      Excl. in [place]? --     Vital signs reviewed, nursing assessments reviewed.   Constitutional:   Alert and oriented. Non-toxic appearance. Eyes:   Conjunctivae are normal. EOMI. PERRL. ENT      Head:   Normocephalic and atraumatic.      Nose:   Wearing a mask.      Mouth/Throat:   Wearing a mask.      Neck:   No meningismus. Full ROM. Hematological/Lymphatic/Immunilogical:   No cervical lymphadenopathy. Cardiovascular:   Irregularly irregular rhythm, heart rate of 120. Symmetric bilateral radial and DP pulses.  No murmurs. Cap refill less than 2 seconds. Respiratory:   Normal respiratory effort without tachypnea/retractions. Breath sounds are clear and equal bilaterally. No wheezes/rales/rhonchi. Gastrointestinal:   Soft and nontender. Non distended. There is no CVA tenderness.  No rebound, rigidity, or guarding.  Musculoskeletal:   Normal range of motion in all extremities. No joint effusions.  No lower extremity tenderness.  1+ pitting edema bilaterally.  Symmetric calf circumference, no calf tenderness or  palpable cord. Neurologic:   Normal speech and language.  Motor grossly intact. No acute focal neurologic deficits are appreciated.  Skin:    Skin is warm, dry and intact. No rash noted.  No petechiae, purpura, or bullae.  ____________________________________________    LABS (pertinent positives/negatives) (all labs ordered are listed, but only abnormal results are displayed) Labs Reviewed  SARS CORONAVIRUS 2 (TAT 6-24 HRS)  BASIC METABOLIC PANEL  CBC  MAGNESIUM  PHOSPHORUS  TSH  PROTIME-INR  T4, FREE  TROPONIN I (HIGH SENSITIVITY)   ____________________________________________   EKG  Interpreted by me Atrial fibrillation with rapid ventricular response, rate of 123.  Normal axis and intervals.  Normal QRS ST segments and T waves.  No evidence of right heart strain  ____________________________________________    RADIOLOGY  No results found.  ____________________________________________   PROCEDURES .Critical Care Performed by: Carrie Mew, MD Authorized by: Carrie Mew, MD   Critical care provider statement:    Critical care time (minutes):  35   Critical care time was exclusive of:  Separately billable procedures and treating other patients   Critical care was necessary to treat or prevent imminent or life-threatening deterioration of the following conditions:  Shock and cardiac failure   Critical care was time spent personally by me on the following activities:  Development of treatment plan with patient or surrogate, discussions with consultants, evaluation of patient's response to treatment, examination of patient, obtaining history from patient or surrogate, ordering and performing treatments and interventions, ordering and review of laboratory studies, ordering and review of radiographic studies, pulse oximetry, re-evaluation of patient's condition and review of old charts    ____________________________________________  DIFFERENTIAL  DIAGNOSIS   New onset atrial fibrillation, non-STEMI, heart failure, pulmonary edema, pleural effusion, hyperthyroidism, electrolyte abnormality, dehydration  CLINICAL IMPRESSION / ASSESSMENT AND PLAN / ED COURSE  Medications ordered in the ED: Medications  sodium chloride 0.9 % bolus 1,000 mL (has no administration in time range)  diltiazem (CARDIZEM) injection 15 mg (has no administration in time range)    Pertinent labs & imaging results that were available during my care of the patient were reviewed by me and considered in my medical decision making (see chart for details).  Sita A Quimby was evaluated in Emergency Department on 10/20/2019 for the symptoms described in the history of present illness. She was evaluated in the context of the global COVID-19 pandemic, which necessitated consideration that the patient might be at risk for infection with the SARS-CoV-2 virus that causes COVID-19. Institutional protocols and algorithms that pertain to the evaluation of patients at risk for COVID-19 are in a state of rapid change based on information released by regulatory bodies including the CDC and federal and state organizations. These policies and algorithms were followed during the patient's care in the ED.   Patient presents with new onset A. fib with RVR.  Also tachypneic.  No evidence of infection.  Patient's not septic.  Will give a dose of IV diltiazem for rate control, check labs and chest x-ray, plan for hospitalization for further cardiac work-up.  Low suspicion of PE at this time given lack of pleuritic symptoms, resolution of chest pain, no hypoxia.  However, with age and exogenous hormone use, I will add a D-dimer.   ----------------------------------------- 8:17 PM on 10/20/2019 -----------------------------------------  Chest x-ray negative, troponin negative.  After diltiazem, heart rate is in the 70s, still in A. fib.  Blood pressure remained stable.  Discussed with the  hospitalist for further management.     ____________________________________________   FINAL CLINICAL IMPRESSION(S) / ED DIAGNOSES    Final diagnoses:  New onset atrial fibrillation ([place])  Atrial fibrillation with rapid ventricular response (HCC)  Chest pain, unspecified type     ED Discharge Orders    None      Portions of this note were generated with dragon dictation software. Dictation errors may occur despite best attempts at proofreading.   Carrie Mew, MD 10/20/19 2017

## 2019-10-20 NOTE — ED Triage Notes (Signed)
Pt reports she is currently on a prednisone taper.

## 2019-10-20 NOTE — ED Notes (Signed)
Pharmacy messaged for medications

## 2019-10-21 ENCOUNTER — Other Ambulatory Visit: Payer: Self-pay

## 2019-10-21 ENCOUNTER — Inpatient Hospital Stay
Admit: 2019-10-21 | Discharge: 2019-10-21 | Disposition: A | Discharge status: Home / Self Care | Payer: Medicare HMO | Attending: Family Medicine | Admitting: Family Medicine

## 2019-10-21 DIAGNOSIS — K219 Gastro-esophageal reflux disease without esophagitis: Secondary | ICD-10-CM

## 2019-10-21 DIAGNOSIS — I10 Essential (primary) hypertension: Secondary | ICD-10-CM

## 2019-10-21 LAB — BASIC METABOLIC PANEL
Anion gap: 9 (ref 5–15)
Anion gap: 8 (ref 5–15)
BUN: 19 mg/dL (ref 8–23)
BUN: 17 mg/dL (ref 8–23)
CO2: 27 mmol/L (ref 22–32)
CO2: 25 mmol/L (ref 22–32)
Calcium: 8.7 mg/dL — ABNORMAL LOW (ref 8.9–10.3)
Calcium: 8.5 mg/dL — ABNORMAL LOW (ref 8.9–10.3)
Chloride: 107 mmol/L (ref 98–111)
Chloride: 106 mmol/L (ref 98–111)
Creatinine, Ser: 0.86 mg/dL (ref 0.44–1.00)
Creatinine, Ser: 0.88 mg/dL (ref 0.44–1.00)
GFR calc Af Amer: >60 mL/min (ref >60)
GFR calc Af Amer: >60 mL/min (ref >60)
GFR calc non Af Amer: >60 mL/min (ref >60)
GFR calc non Af Amer: >60 mL/min (ref >60)
Glucose, Bld: 202 mg/dL — ABNORMAL HIGH (ref 70–99)
Glucose, Bld: 195 mg/dL — ABNORMAL HIGH (ref 70–99)
Potassium: 3.2 mmol/L — ABNORMAL LOW (ref 3.5–5.1)
Potassium: 3.8 mmol/L (ref 3.5–5.1)
Sodium: 143 mmol/L (ref 135–145)
Sodium: 139 mmol/L (ref 135–145)

## 2019-10-21 LAB — TROPONIN I (HIGH SENSITIVITY)
Troponin I (High Sensitivity): 14 ng/L
Troponin I (High Sensitivity): 13 ng/L (ref <18)
Troponin I (High Sensitivity): 13 ng/L (ref <18)

## 2019-10-21 LAB — CBC
HCT: 37.6 % (ref 36.0–46.0)
Hemoglobin: 12.3 g/dL (ref 12.0–15.0)
MCH: 29.1 pg (ref 26.0–34.0)
MCHC: 32.7 g/dL (ref 30.0–36.0)
MCV: 88.9 fL (ref 80.0–100.0)
Platelets: 259 10*3/uL (ref 150–400)
RBC: 4.23 MIL/uL (ref 3.87–5.11)
RDW: 13.6 % (ref 11.5–15.5)
WBC: 14.2 10*3/uL — ABNORMAL HIGH (ref 4.0–10.5)
nRBC: 0 % (ref 0.0–0.2)

## 2019-10-21 LAB — LIPID PANEL
Cholesterol: 184 mg/dL (ref 0–200)
HDL: 57 mg/dL (ref >40)
LDL Cholesterol: 115 mg/dL — ABNORMAL HIGH (ref 0–99)
Total CHOL/HDL Ratio: 3.2 RATIO
Triglycerides: 61 mg/dL (ref <150)
VLDL: 12 mg/dL (ref 0–40)

## 2019-10-21 LAB — T4, FREE: Free T4: 1.25 ng/dL — ABNORMAL HIGH (ref 0.61–1.12)

## 2019-10-21 LAB — ECHOCARDIOGRAM COMPLETE
Height: 65 in
Weight: 4118.4 oz

## 2019-10-21 LAB — SARS CORONAVIRUS 2 (TAT 6-24 HRS): SARS Coronavirus 2: NEGATIVE

## 2019-10-21 MED ORDER — LOSARTAN POTASSIUM 50 MG PO TABS: 100.0000 mg | ORAL_TABLET | Freq: Every day | ORAL | Status: DC

## 2019-10-21 MED ORDER — SOTALOL HCL 80 MG PO TABS
80.0000 mg | ORAL_TABLET | Freq: Every day | ORAL | Status: DC
  Administered 2019-10-21 – 2019-10-22 (×2): 80 mg via ORAL
  Filled 2019-10-21 (×2): qty 1

## 2019-10-21 MED ORDER — HYDROXYZINE HCL 10 MG PO TABS
10.0000 mg | ORAL_TABLET | Freq: Three times a day (TID) | ORAL | Status: DC | PRN
  Filled 2019-10-21: qty 2

## 2019-10-21 MED ORDER — AZELASTINE HCL 0.1 % NA SOLN
1.0000 | Freq: Two times a day (BID) | NASAL | Status: DC
  Filled 2019-10-21: qty 30

## 2019-10-21 MED ORDER — PANTOPRAZOLE SODIUM 40 MG PO TBEC
40.0000 mg | DELAYED_RELEASE_TABLET | Freq: Two times a day (BID) | ORAL | Status: DC
  Administered 2019-10-21 – 2019-10-22 (×2): 40 mg via ORAL
  Filled 2019-10-21 (×2): qty 1

## 2019-10-21 MED ORDER — DULOXETINE HCL 30 MG PO CPEP
30.0000 mg | ORAL_CAPSULE | Freq: Every day | ORAL | Status: DC
  Administered 2019-10-21: 30 mg via ORAL
  Filled 2019-10-21 (×2): qty 1

## 2019-10-21 MED ORDER — TRAMADOL HCL 50 MG PO TABS: 50.0000 mg | ORAL_TABLET | Freq: Two times a day (BID) | ORAL | Status: DC | PRN

## 2019-10-21 MED ORDER — POTASSIUM CHLORIDE 10 MEQ/100ML IV SOLN
10.0000 meq | INTRAVENOUS | Status: DC
  Filled 2019-10-21 (×4): qty 100

## 2019-10-21 NOTE — Consult Note (Signed)
Amanda Holmes is a 68 y.o. female  [id number]  Primary Cardiologist: New patient to Dr. Earlyne Iba Reason for Consultation: Atrial fibrillation with RVR  HPI: 68yo female with a past medical history of GERD, HTN, and hypothyroidism presented to ED with palpitations and chest pain and shortness of breath. She has had increased edema and orthopnea in the last few months and has gained 5lbs in the last few days.   Review of Systems: Has mild palpitations, mild shortness of breath with exertion, otherwise feeling well.    Past Medical History:  Diagnosis Date  . Cancer (HCC)    skin ca squamous cell  . GERD (gastroesophageal reflux disease)   . Hypertension   . Hypothyroidism   . PONV (postoperative nausea and vomiting)     Medications Prior to Admission  Medication Sig Dispense Refill  . acetaminophen (TYLENOL) 500 MG tablet Take 500-1,000 mg by mouth 2 (two) times daily. 2 tabs in the am, 1 tab at bedtime    . azelastine (ASTELIN) 0.1 % nasal spray Place 1 spray into both nostrils 2 (two) times daily. Use in each nostril as directed    . DULoxetine (CYMBALTA) 30 MG capsule Take 30 mg by mouth daily.    . Estradiol-Norethindrone Acet (AMABELZ) 0.5-0.1 MG tablet Take 1 tablet by mouth daily.    . hydrochlorothiazide (HYDRODIURIL) 12.5 MG tablet Take 12.5 mg by mouth daily.    . hydrOXYzine (ATARAX/VISTARIL) 10 MG tablet Take 1-2 tablets by mouth 3 (three) times daily as needed.    Marland Kitchen levothyroxine (SYNTHROID, LEVOTHROID) 200 MCG tablet Take 200 mcg by mouth daily before breakfast.    . loratadine (CLARITIN) 10 MG tablet Take 10 mg by mouth daily.    Marland Kitchen losartan (COZAAR) 100 MG tablet Take 100 mg by mouth daily.    Marland Kitchen nystatin cream (MYCOSTATIN) Apply 1 application topically 2 (two) times daily.    . pantoprazole (PROTONIX) 40 MG tablet Take 40 mg by mouth 2 (two) times daily.    . predniSONE (DELTASONE) 10 MG tablet Take 10 mg by mouth 2 (two) times daily.    . traMADol (ULTRAM) 50  MG tablet Take 1 tablet by mouth 2 (two) times daily as needed.       . diltiazem  30 mg Oral Q6H  . famotidine  20 mg Oral Daily  . losartan  100 mg Oral Daily   And  . hydrochlorothiazide  12.5 mg Oral Daily  . levothyroxine  200 mcg Oral QAC breakfast  . loratadine  10 mg Oral Daily  . potassium chloride SA  10 mEq Oral Daily  . rivaroxaban  20 mg Oral Q supper  . sodium chloride flush  3 mL Intravenous Q12H    Infusions: . sodium chloride      Allergies  Allergen Reactions  . Celebrex [Celecoxib] Anaphylaxis and Shortness Of Breath    Social History   Socioeconomic History  . Marital status: Married    Spouse name: Not on file  . Number of children: Not on file  . Years of education: Not on file  . Highest education level: Not on file  Occupational History  . Not on file  Social Needs  . Financial resource strain: Not hard at all  . Food insecurity    Worry: Never true    Inability: Never true  . Transportation needs    Medical: No    Non-medical: No  Tobacco Use  . Smoking status: Never Smoker  .  Smokeless tobacco: Never Used  Substance and Sexual Activity  . Alcohol use: No  . Drug use: No  . Sexual activity: Not Currently    Birth control/protection: Post-menopausal  Lifestyle  . Physical activity    Days per week: 3 days    Minutes per session: 40 min  . Stress: Only a little  Relationships  . Social connections    Talks on phone: More than three times a week    Gets together: More than three times a week    Attends religious service: More than 4 times per year    Active member of club or organization: Yes    Attends meetings of clubs or organizations: More than 4 times per year    Relationship status: Married  . Intimate partner violence    Fear of current or ex partner: No    Emotionally abused: No    Physically abused: No    Forced sexual activity: No  Other Topics Concern  . Not on file  Social History Narrative  . Not on file     Family History  Problem Relation Age of Onset  . Breast cancer Sister 27    PHYSICAL EXAM: Vitals:   10/21/19 0431 10/21/19 0735  BP: 138/77 (!) 146/102  Pulse: 94 67  Resp: 20 19  Temp: 98.5 F (36.9 C) 98.3 F (36.8 C)  SpO2: 98% 98%     Intake/Output Summary (Last 24 hours) at 10/21/2019 [id number] Last data filed at 10/21/2019 0559 Gross per 24 hour  Intake 1000 ml  Output 250 ml  Net 750 ml    General:  Well appearing. No respiratory difficulty HEENT: normal Neck: supple. no JVD. Carotids 2+ bilat; no bruits. No lymphadenopathy or thryomegaly appreciated. Cor: PMI nondisplaced. Regular rate & rhythm. No rubs, gallops or murmurs. Lungs: clear Abdomen: soft, nontender, nondistended. No hepatosplenomegaly. No bruits or masses. Good bowel sounds. Extremities: no cyanosis, clubbing, rash, edema Neuro: alert & oriented x 3, cranial nerves grossly intact. moves all 4 extremities w/o difficulty. Affect pleasant.  ECG: Atrial fibrillation 123bpm with rapid ventricular response Nonspecific ST abnormality Abnormal ECG  Results for orders placed or performed during the hospital encounter of 10/20/19 (from the past 24 hour(s))  Basic metabolic panel     Status: Abnormal   Collection Time: 10/20/19  7:26 PM  Result Value Ref Range   Sodium 143 135 - 145 mmol/L   Potassium 3.4 (L) 3.5 - 5.1 mmol/L   Chloride 105 98 - 111 mmol/L   CO2 25 22 - 32 mmol/L   Glucose, Bld 186 (H) 70 - 99 mg/dL   BUN 16 8 - 23 mg/dL   Creatinine, Ser 0.90 0.44 - 1.00 mg/dL   Calcium 9.3 8.9 - 10.3 mg/dL   GFR calc non Af Amer >60 >60 mL/min   GFR calc Af Amer >60 >60 mL/min   Anion gap 13 5 - 15  CBC     Status: Abnormal   Collection Time: 10/20/19  7:26 PM  Result Value Ref Range   WBC 12.3 (H) 4.0 - 10.5 K/uL   RBC 4.43 3.87 - 5.11 MIL/uL   Hemoglobin 12.9 12.0 - 15.0 g/dL   HCT 39.9 36.0 - 46.0 %   MCV 90.1 80.0 - 100.0 fL   MCH 29.1 26.0 - 34.0 pg   MCHC 32.3 30.0 - 36.0 g/dL   RDW  13.3 11.5 - 15.5 %   Platelets 274 150 - 400 K/uL   nRBC 0.0  0.0 - 0.2 %  Magnesium     Status: None   Collection Time: 10/20/19  7:26 PM  Result Value Ref Range   Magnesium 2.1 1.7 - 2.4 mg/dL  Phosphorus     Status: Abnormal   Collection Time: 10/20/19  7:26 PM  Result Value Ref Range   Phosphorus 2.3 (L) 2.5 - 4.6 mg/dL  TSH     Status: Abnormal   Collection Time: 10/20/19  7:26 PM  Result Value Ref Range   TSH 0.126 (L) 0.350 - 4.500 uIU/mL  Protime-INR     Status: None   Collection Time: 10/20/19  7:26 PM  Result Value Ref Range   Prothrombin Time 13.3 11.4 - 15.2 seconds   INR 1.0 0.8 - 1.2  Troponin I (High Sensitivity)     Status: None   Collection Time: 10/20/19  7:26 PM  Result Value Ref Range   Troponin I (High Sensitivity) 7 <18 ng/L  T4, free     Status: Abnormal   Collection Time: 10/20/19  7:26 PM  Result Value Ref Range   Free T4 1.25 (H) 0.61 - 1.12 ng/dL  Fibrin derivatives D-Dimer     Status: None   Collection Time: 10/20/19  7:26 PM  Result Value Ref Range   Fibrin derivatives D-dimer ([facility]) 316.80 0.00 - 499.00 ng/mL (FEU)  Brain natriuretic peptide     Status: Abnormal   Collection Time: 10/20/19  7:26 PM  Result Value Ref Range   B Natriuretic Peptide 108.0 (H) 0.0 - 100.0 pg/mL  SARS CORONAVIRUS 2 (TAT 6-24 HRS) Nasopharyngeal Nasopharyngeal Swab     Status: None   Collection Time: 10/20/19  7:35 PM   Specimen: Nasopharyngeal Swab  Result Value Ref Range   SARS Coronavirus 2 NEGATIVE NEGATIVE  Troponin I (High Sensitivity)     Status: None   Collection Time: 10/21/19  1:12 AM  Result Value Ref Range   Troponin I (High Sensitivity) 14 <18 ng/L  Basic metabolic panel     Status: Abnormal   Collection Time: 10/21/19  1:12 AM  Result Value Ref Range   Sodium 143 135 - 145 mmol/L   Potassium 3.2 (L) 3.5 - 5.1 mmol/L   Chloride 107 98 - 111 mmol/L   CO2 27 22 - 32 mmol/L   Glucose, Bld 202 (H) 70 - 99 mg/dL   BUN 19 8 - 23 mg/dL   Creatinine,  Ser 0.86 0.44 - 1.00 mg/dL   Calcium 8.7 (L) 8.9 - 10.3 mg/dL   GFR calc non Af Amer >60 >60 mL/min   GFR calc Af Amer >60 >60 mL/min   Anion gap 9 5 - 15  Lipid panel     Status: Abnormal   Collection Time: 10/21/19  1:12 AM  Result Value Ref Range   Cholesterol 184 0 - 200 mg/dL   Triglycerides 61 <150 mg/dL   HDL 57 >40 mg/dL   Total CHOL/HDL Ratio 3.2 RATIO   VLDL 12 0 - 40 mg/dL   LDL Cholesterol 115 (H) 0 - 99 mg/dL  CBC     Status: Abnormal   Collection Time: 10/21/19  1:12 AM  Result Value Ref Range   WBC 14.2 (H) 4.0 - 10.5 K/uL   RBC 4.23 3.87 - 5.11 MIL/uL   Hemoglobin 12.3 12.0 - 15.0 g/dL   HCT 37.6 36.0 - 46.0 %   MCV 88.9 80.0 - 100.0 fL   MCH 29.1 26.0 - 34.0 pg   MCHC 32.7 30.0 -  36.0 g/dL   RDW 13.6 11.5 - 15.5 %   Platelets 259 150 - 400 K/uL   nRBC 0.0 0.0 - 0.2 %   Dg Chest Portable 1 View  Result Date: 10/20/2019 CLINICAL DATA:  Chest pain and tachycardia EXAM: PORTABLE CHEST 1 VIEW COMPARISON:  None. FINDINGS: Cardiac shadow is within normal limits. Aortic calcifications are seen. The lungs are well aerated bilaterally. No focal infiltrate or sizable effusion is noted. No acute bony abnormality is noted. IMPRESSION: No active disease. Electronically Signed   By: Inez Catalina M.D.   On: 10/20/2019 19:34     ASSESSMENT AND PLAN:  Afib RVR: Remains in afib, rate is currently controlled. Echo shows EF 55%.   Start sotalol 80mg /day, and wean off cardizem.  T4 is high, advise decrease in synthroid per hospitalist.  Chest pain: Troponin has been negative x2. Advise serial troponins for a total of 4  And outpatient workup for CAD if it remains negative.  asymptomatic   If patient remains stable may be discharged tomorrow with follow up Thursday 11/12.  Jake Bathe, NP-C Cell: [phone]

## 2019-10-21 NOTE — Progress Notes (Signed)
*  PRELIMINARY RESULTS* Echocardiogram 2D Echocardiogram has been performed.  Sherrie Sport 10/21/2019, 10:14 AM

## 2019-10-21 NOTE — Progress Notes (Signed)
PROGRESS NOTE  Amanda Holmes [id number] DOB: 1951-10-10 DOA: 10/20/2019 PCP: Adin Hector, MD  Brief History   The patient is a 68 yr old woman who presented to [facility] ED with complaints of  Acute onset of palpitations and chest pain that she described as a fluttering with tightness that radiated to her left arm and jaw. She stated that it was 2-3/10 in severity. The pain is not associated with nausea, vomiting, or diaphoresis. At home she has ben having orthopnea, lower extremity edema with paroxysmal nocturnal dyspnea and dyspnea on exertion. This has been going on for a bout one month.   She has a past medical history significant for HTN, hypothyroidism, Post-operative nausea and vomiting, skin cancer, and GERD.  In the ED she was found to be in atrial fibrillation with RVR. This was converted to sinus with injection of 15 mg of cardizem. Heart rate also slowed with infusion of 1 L IV NS. The patient was admitted to a telemetry bed for further evaluation and care. Echocardiogram is pending and cardiology has been consulted.  Consultants  . Cardiology  Procedures  . None  Antibiotics   Anti-infectives (From admission, onward)   None    .  Subjective  The patient is sitting up at bedside. No new complaints.  Objective   Vitals:  Vitals:   10/21/19 0431 10/21/19 0735  BP: 138/77 (!) 146/102  Pulse: 94 67  Resp: 20 19  Temp: 98.5 F (36.9 C) 98.3 F (36.8 C)  SpO2: 98% 98%   Exam:  Constitutional:  . The patient is awake, alert, and oriented x 3. No acute distress. Respiratory:  . No increased work of breathing. . No wheezes, rales, or rhonchi . No tactile fremitus Cardiovascular:  . Irregular rate and rhythm . No murmurs, ectopy, or gallups. . No lateral PMI. No thrills. Abdomen:  . Abdomen is soft, non-tender, non-distended . No hernias, masses, or organomegaly . Normoactive bowel sounds.  Musculoskeletal:  . No cyanosis or clubbing  . +2-3  pitting edema of lower extremities bilaterally Skin:  . No rashes, lesions, ulcers . palpation of skin: no induration or nodules Neurologic:  . CN 2-12 intact . Sensation all 4 extremities intact Psychiatric:  . Mental status o Mood, affect appropriate o Orientation to person, place, time  . judgment and insight appear intact  I have personally reviewed the following:   Today's Data  . Vitals, BMP, Troponin  Micro Data  . COVID negative  Imaging  . CXR - negative.  Cardiology Data  . EKG: Atrial fibrillatioin with RVR . Echocardiogram: EF 60-65%, LVH, No right sided dysfunction  Scheduled Meds: . diltiazem  30 mg Oral Q6H  . famotidine  20 mg Oral Daily  . losartan  100 mg Oral Daily   And  . hydrochlorothiazide  12.5 mg Oral Daily  . levothyroxine  200 mcg Oral QAC breakfast  . loratadine  10 mg Oral Daily  . potassium chloride SA  10 mEq Oral Daily  . rivaroxaban  20 mg Oral Q supper  . sodium chloride flush  3 mL Intravenous Q12H  . sotalol  80 mg Oral Daily   Continuous Infusions: . sodium chloride      Active Problems:   Atrial fibrillation with RVR (HCC)   LOS: 1 day   A & P   New onset atrial fibrillation with right ventricular response:  The patient will be admitted to a telemetry bed.  We will continue  him on p.o. Cardizem with holding parameters.  We will utilize IV Cardizem drip if need be.  She will be hydrated with IV normal saline.  We will obtain a 2D echo and cardiology consultation in a.m.  I notified Dr. Percival Spanish about the patient.  The patient's [place] 2 vascular score is 2.  She will be placed on p.o. Xarelto.  D-dimer came back normal. Echocardiogram is unremarkable.  Hypokalemia:  Resolved with Potassium supplementation.  Magnesium is normal.  Hypertension: We will continue HCTZ and monitor potassium level.  Hypothyroidism: We will continue Synthroid and check free T3 and free T4 given low TSH level which could be related to hormonal  therapy.  GERD:  The patient will be placed on continued PPI therapy.  DVT prophylaxis: The patient will be placed on Xarelto.  I have seen and examined this patient myself. I have spent 32 minutes in her evaluation and care.  Orlandus Borowski, DO [facility] Direct contact: see [email or web address]  7PM-7AM contact night coverage as above 10/21/2019, 3:23 PM  LOS: 1 day

## 2019-10-22 LAB — BASIC METABOLIC PANEL
Anion gap: 8 (ref 5–15)
BUN: 22 mg/dL (ref 8–23)
CO2: 27 mmol/L (ref 22–32)
Calcium: 8.8 mg/dL — ABNORMAL LOW (ref 8.9–10.3)
Chloride: 106 mmol/L (ref 98–111)
Creatinine, Ser: 1 mg/dL (ref 0.44–1.00)
GFR calc Af Amer: >60 mL/min (ref >60)
GFR calc non Af Amer: 59 mL/min — ABNORMAL LOW (ref >60)
Glucose, Bld: 145 mg/dL — ABNORMAL HIGH (ref 70–99)
Potassium: 3.7 mmol/L (ref 3.5–5.1)
Sodium: 141 mmol/L (ref 135–145)

## 2019-10-22 LAB — CBC
HCT: 38.2 % (ref 36.0–46.0)
Hemoglobin: 11.9 g/dL — ABNORMAL LOW (ref 12.0–15.0)
MCH: 28.7 pg (ref 26.0–34.0)
MCHC: 31.2 g/dL (ref 30.0–36.0)
MCV: 92.3 fL (ref 80.0–100.0)
Platelets: 269 10*3/uL (ref 150–400)
RBC: 4.14 MIL/uL (ref 3.87–5.11)
RDW: 13.9 % (ref 11.5–15.5)
WBC: 11.6 10*3/uL — ABNORMAL HIGH (ref 4.0–10.5)
nRBC: 0 % (ref 0.0–0.2)

## 2019-10-22 LAB — T3, FREE: T3, Free: 2.4 pg/mL (ref 2.0–4.4)

## 2019-10-22 MED ORDER — LEVOTHYROXINE SODIUM 100 MCG PO TABS: 100.0000 ug | ORAL_TABLET | Freq: Every day | ORAL | 0 refills | Status: DC

## 2019-10-22 MED ORDER — LEVOTHYROXINE SODIUM 100 MCG PO TABS: 100.0000 ug | ORAL_TABLET | Freq: Every day | ORAL | Status: DC

## 2019-10-22 MED ORDER — RIVAROXABAN 20 MG PO TABS: 20.0000 mg | ORAL_TABLET | Freq: Every day | ORAL | 0 refills | Status: DC

## 2019-10-22 MED ORDER — SOTALOL HCL 80 MG PO TABS: 80.0000 mg | ORAL_TABLET | Freq: Every day | ORAL | 0 refills | Status: DC

## 2019-10-22 NOTE — Progress Notes (Signed)
SUBJECTIVE: Patient is feeling much better denies any chest pain shortness of breath   Vitals:   10/21/19 1932 10/21/19 2354 10/22/19 0355 10/22/19 0741  BP: 121/64 118/79 (!) 117/58 139/65  Pulse: (!) 58 (!) 57 66 68  Resp: 18  18 20   Temp: 98.5 F (36.9 C)  98 F (36.7 C) 98.4 F (36.9 C)  TempSrc: Oral  Oral Oral  SpO2: 97%  96% 97%  Weight:   115.5 kg   Height:        Intake/Output Summary (Last 24 hours) at 10/22/2019 0914 Last data filed at 10/22/2019 [id number] Gross per 24 hour  Intake 240 ml  Output 1000 ml  Net -760 ml    LABS: Basic Metabolic Panel: Recent Labs    10/20/19 1926  10/21/19 0908 10/22/19 0445  NA 143   < > 139 141  K 3.4*   < > 3.8 3.7  CL 105   < > 106 106  CO2 25   < > 25 27  GLUCOSE 186*   < > 195* 145*  BUN 16   < > 17 22  CREATININE 0.90   < > 0.88 1.00  CALCIUM 9.3   < > 8.5* 8.8*  MG 2.1  --   --   --   PHOS 2.3*  --   --   --    < > = values in this interval not displayed.   Liver Function Tests: No results for input(s): AST, ALT, ALKPHOS, BILITOT, PROT, ALBUMIN in the last 72 hours. No results for input(s): LIPASE, AMYLASE in the last 72 hours. CBC: Recent Labs    10/21/19 0112 10/22/19 0445  WBC 14.2* 11.6*  HGB 12.3 11.9*  HCT 37.6 38.2  MCV 88.9 92.3  PLT 259 269   Cardiac Enzymes: No results for input(s): CKTOTAL, CKMB, CKMBINDEX, TROPONINI in the last 72 hours. BNP: Invalid input(s): POCBNP D-Dimer: No results for input(s): DDIMER in the last 72 hours. Hemoglobin A1C: No results for input(s): HGBA1C in the last 72 hours. Fasting Lipid Panel: Recent Labs    10/21/19 0112  CHOL 184  HDL 57  LDLCALC 115*  TRIG 61  CHOLHDL 3.2   Thyroid Function Tests: Recent Labs    10/20/19 1926 10/21/19 0112  TSH 0.126*  --   T3FREE  --  2.4   Anemia Panel: No results for input(s): VITAMINB12, FOLATE, FERRITIN, TIBC, IRON, RETICCTPCT in the last 72 hours.   PHYSICAL EXAM General: Well developed, well nourished,  in no acute distress HEENT:  Normocephalic and atramatic Neck:  No JVD.  Lungs: Clear bilaterally to auscultation and percussion. Heart: HRRR . Normal S1 and S2 without gallops or murmurs.  Abdomen: Bowel sounds are positive, abdomen soft and non-tender  Msk:  Back normal, normal gait. Normal strength and tone for age. Extremities: No clubbing, cyanosis or edema.   Neuro: Alert and oriented X 3. Psych:  Good affect, responds appropriately  TELEMETRY: Sinus rhythm  ASSESSMENT AND PLAN: Atrial fibrillation with rapid ventricular response rate.  Patient is currently doing well and can be discharged on sotalol 80 mg as well as Xarelto.  We will see the patient Friday at 10:00 in the office. Active Problems:   Atrial fibrillation with RVR (HCC)    Neoma Laming A, MD, [facility] 10/22/2019 9:14 AM

## 2019-10-22 NOTE — Clinical Social Work Note (Signed)
Patient has orders to discharge home today. Provided Xarelto 30-day free trial card and requested benefits check. Patient's husband will pick her up today. No further concerns. CSW signing off.  Dayton Scrape, [place]

## 2019-10-22 NOTE — Progress Notes (Signed)
Patient alert and oriented, vss, no complaints of pain.  D/c telemetry and piv.  Escorted out of hospital via wheelchair by volunteers.   

## 2019-10-22 NOTE — TOC Benefit Eligibility Note (Signed)
Transition of Care [facility]) Benefit Eligibility Note    Patient Details  Name: KIRBI FARRUGIA MRN: [id number] Date of Birth: 17-Jul-1951   Medication/Dose: Xarelto 59m  - 1 tablet daily  Covered?: Yes  Prescription Coverage Preferred Pharmacy: [facility]  Spoke with Person/Company/Phone Number:: [place] with [employer] at [phone] Co-Pay: $47.00 estimated copay for 30 day supply retail and $141.00 estimated copay for 90 day supply by mail  Prior Approval: No  Deduct[place]: [place]: [phone] [phone]/09/2019, 12:42 PM

## 2019-10-22 NOTE — Discharge Summary (Signed)
Physician Discharge Summary  Amanda Holmes [id number] DOB: July 02, 1951 DOA: 10/20/2019  PCP: Adin Hector, MD  Admit date: 10/20/2019 Discharge date: 10/22/2019  Recommendations for Outpatient Follow-up:  1. Discharge to home 2. Follow up with atrial fibrillation 3. Follow up with PCP in 7-10 days. 4. Have TSH, FT4 and FT3 checked in 6 weeks 5. Follow up with cardiology as directed.  Follow-up Information    Adin Hector, MD. Go on 10/29/2019.   Specialty: Internal Medicine Why: APPOINTMENT AT 10:45AM Contact information: [address] [place] [address] [phone]        Dionisio David, MD. Go on 10/29/2019.   Specialty: Cardiology Why: APPOINTMENT AT 1:45PM Contact information: [place] [address] [phone]          Discharge Diagnoses: Principal diagnosis is #1 Chest pain Atrial fibrillation with RVR Hypokalemia Hypertension Hypothyroidism GERD  Discharge Condition: Fair  Disposition: Home with home health  Diet recommendation: Heart healthy  Filed Weights   10/21/19 0120 10/21/19 0431 10/22/19 0355  Weight: 116.8 kg 116.8 kg 115.5 kg    History of present illness:  Amanda Holmes  is a 68 y.o. Caucasian female with a known history of hypertension and hypothyroidism, who presented to the emergency room with acute onset of palpitations with associated chest pain felt like fluttering and tightness and graded 2-3/10 in severity with radiation to her left arm as well as her jaw.  She denies any nausea vomiting or diaphoresis.  She has been having dyspnea with associated orthopnea and paroxysmal nocturnal dyspnea and lower extremity edema with dyspnea on exertion which have been going on over the last month.  No dysuria, oliguria or hematuria or flank pain.  No fever or chills or cough or hemoptysis.  No recent sick exposures.  Upon presentation to the emergency room, blood pressure was  135/65 with heart rate of 121 respiratory to 24 and pulse currently 96% on room air.  Labs revealed potassium of 3.4 and magnesium of 2.1 with blood glucose of 186 and CBC with WBC of 12.3.  Fibrin derivatives D-dimer was 316.8 and INR was 1 with PT of 13.3.  TSH was 0.126 and her  COVID-19 test is currently pending.  For which x-ray showed no acute cardiopulmonary disease.  EKG showed atrial fibrillation with rapid ventricular spots of 123.  The patient was given f 15 g of IV Cardizem with slowing down of the heart rate to 70 and 1 L bolus of IV normal saline.  During the interview heart rate was going up to 129 and came down to low 100s.  She will be admitted to a telemetry bed for further evaluation and management.   Hospital Course:  In the ED she was found to be in atrial fibrillation with RVR. This was converted to sinus with injection of 15 mg of cardizem. Heart rate also slowed with infusion of 1 L IV NS. The patient was admitted to a telemetry bed for further evaluation and care. Echocardiogram was performed and demonstrated EF of 60-65%  and cardiology has been consulted.  The patient's heart rate is controlled. She has been cleared for discharge by cardiology.  Today's assessment: S: The patient is resting comfortably. No new complaints. O: Vitals:  Vitals:   10/22/19 0355 10/22/19 0741  BP: (!) 117/58 139/65  Pulse: 66 68  Resp: 18 20  Temp: 98 F (36.7 C) 98.4 F (36.9 C)  SpO2: 96% 97%  Exam:  Constitutional:  . The patient is awake, alert, and oriented x 3. No acute distress. Respiratory:  . No increased work of breathing. . No wheezes, rales, or rhonchi . No tactile fremitus Cardiovascular:  . Regular rate and rhythm . No murmurs, ectopy, or gallups. . No lateral PMI. No thrills. Abdomen:  . Abdomen is soft, non-tender, non-distended . No hernias, masses, or organomegaly . Normoactive bowel sounds.  Musculoskeletal:  . No cyanosis, clubbing, or edema Skin:   . No rashes, lesions, ulcers . palpation of skin: no induration or nodules Neurologic:  . CN 2-12 intact . Sensation all 4 extremities intact Psychiatric:  . Mental status o Mood, affect appropriate o Orientation to person, place, time  . judgment and insight appear intact  Discharge Instructions  Discharge Instructions    Activity as tolerated - No restrictions   Complete by: As directed    Amb referral to AFIB Clinic   Complete by: As directed    Call MD for:  difficulty breathing, headache or visual disturbances   Complete by: As directed    Call MD for:  persistant dizziness or light-headedness   Complete by: As directed    Diet - low sodium heart healthy   Complete by: As directed    Discharge instructions   Complete by: As directed    Follow up with PCP in 7-10 days. Have TSH, Free T4, and Free T3 checked in 6 weeks. Report results to PCP. Follow up with cardiology as directed. Follow up with atrial fibrillation clinic.   Increase activity slowly   Complete by: As directed      Allergies as of 10/22/2019      Reactions   Celebrex [celecoxib] Anaphylaxis, Shortness Of Breath      Medication List    STOP taking these medications   Amabelz 0.5-0.1 MG tablet Generic drug: Estradiol-Norethindrone Acet   hydrochlorothiazide 12.5 MG tablet Commonly known as: HYDRODIURIL   predniSONE 10 MG tablet Commonly known as: DELTASONE     TAKE these medications   acetaminophen 500 MG tablet Commonly known as: TYLENOL Take 500-1,000 mg by mouth 2 (two) times daily. 2 tabs in the am, 1 tab at bedtime   azelastine 0.1 % nasal spray Commonly known as: ASTELIN Place 1 spray into both nostrils 2 (two) times daily. Use in each nostril as directed   DULoxetine 30 MG capsule Commonly known as: CYMBALTA Take 30 mg by mouth daily.   hydrOXYzine 10 MG tablet Commonly known as: ATARAX/VISTARIL Take 1-2 tablets by mouth 3 (three) times daily as needed.   levothyroxine 100  MCG tablet Commonly known as: SYNTHROID Take 1 tablet (100 mcg total) by mouth daily before breakfast. Start taking on: October 23, 2019 What changed:   medication strength  how much to take   loratadine 10 MG tablet Commonly known as: CLARITIN Take 10 mg by mouth daily.   losartan 100 MG tablet Commonly known as: COZAAR Take 100 mg by mouth daily.   nystatin cream Commonly known as: MYCOSTATIN Apply 1 application topically 2 (two) times daily.   pantoprazole 40 MG tablet Commonly known as: PROTONIX Take 40 mg by mouth 2 (two) times daily.   rivaroxaban 20 MG Tabs tablet Commonly known as: XARELTO Take 1 tablet (20 mg total) by mouth daily with supper.   sotalol 80 MG tablet Commonly known as: BETAPACE Take 1 tablet (80 mg total) by mouth daily. Start taking on: October 23, 2019   traMADol 50 MG  tablet Commonly known as: ULTRAM Take 1 tablet by mouth 2 (two) times daily as needed.      Allergies  Allergen Reactions  . Celebrex [Celecoxib] Anaphylaxis and Shortness Of Breath    The results of significant diagnostics from this hospitalization (including imaging, microbiology, ancillary and laboratory) are listed below for reference.    Significant Diagnostic Studies: Dg Chest Portable 1 View  Result Date: 10/20/2019 CLINICAL DATA:  Chest pain and tachycardia EXAM: PORTABLE CHEST 1 VIEW COMPARISON:  None. FINDINGS: Cardiac shadow is within normal limits. Aortic calcifications are seen. The lungs are well aerated bilaterally. No focal infiltrate or sizable effusion is noted. No acute bony abnormality is noted. IMPRESSION: No active disease. Electronically Signed   By: Inez Catalina M.D.   On: 10/20/2019 19:34    Microbiology: Recent Results (from the past 240 hour(s))  SARS CORONAVIRUS 2 (TAT 6-24 HRS) Nasopharyngeal Nasopharyngeal Swab     Status: None   Collection Time: 10/20/19  7:35 PM   Specimen: Nasopharyngeal Swab  Result Value Ref Range Status   SARS  Coronavirus 2 NEGATIVE NEGATIVE Final    Comment: (NOTE) SARS-CoV-2 target nucleic acids are NOT DETECTED. The SARS-CoV-2 RNA is generally detectable in upper and lower respiratory specimens during the acute phase of infection. Negative results do not preclude SARS-CoV-2 infection, do not rule out co-infections with other pathogens, and should not be used as the sole basis for treatment or other patient management decisions. Negative results must be combined with clinical observations, patient history, and epidemiological information. The expected result is Negative. Fact Sheet for Patients: SugarRoll.be Fact Sheet for Healthcare Providers: https://www.woods-mathews.com/ This test is not yet approved or cleared by the [place] FDA and  has been authorized for detection and/or diagnosis of SARS-CoV-2 by FDA under an Emergency Use Authorization (EUA). This EUA will remain  in effect (meaning this test can be used) for the duration of the COVID-19 declaration under Section 56 4(b)(1) of the Act, 21 U.S.C. section 360bbb-3(b)(1), unless the authorization is terminated or revoked sooner. Performed at [facility], [place] [address] [place], [place] [address]      Labs: Basic Metabolic Panel: Recent Labs  Lab 10/20/19 1926 10/21/19 0112 10/21/19 0908 10/22/19 0445  NA 143 143 139 141  K 3.4* 3.2* 3.8 3.7  CL 105 107 106 106  CO2 25 27 25 27   GLUCOSE 186* 202* 195* 145*  BUN 16 19 17 22   CREATININE 0.90 0.86 0.88 1.00  CALCIUM 9.3 8.7* 8.5* 8.8*  MG 2.1  --   --   --   PHOS 2.3*  --   --   --    Liver Function Tests: No results for input(s): AST, ALT, ALKPHOS, BILITOT, PROT, ALBUMIN in the last 168 hours. No results for input(s): LIPASE, AMYLASE in the last 168 hours. No results for input(s): AMMONIA in the last 168 hours. CBC: Recent Labs  Lab 10/20/19 1926 10/21/19 0112 10/22/19 0445  WBC 12.3* 14.2* 11.6*  HGB 12.9  12.3 11.9*  HCT 39.9 37.6 38.2  MCV 90.1 88.9 92.3  PLT 274 259 269   Cardiac Enzymes: No results for input(s): CKTOTAL, CKMB, CKMBINDEX, TROPONINI in the last 168 hours. BNP: BNP (last 3 results) Recent Labs    10/20/19 1926  BNP 108.0*    ProBNP (last 3 results) No results for input(s): PROBNP in the last 8760 hours.  CBG: No results for input(s): GLUCAP in the last 168 hours.  Active Problems:  Atrial fibrillation with RVR ([place])   Time coordinating discharge: 38 minutes  Signed:        Antionetta Ator, DO [facility]  10/22/2019, 8:07 PM

## 2020-02-21 ENCOUNTER — Other Ambulatory Visit: Payer: Self-pay | Admitting: Internal Medicine

## 2020-02-21 DIAGNOSIS — Z1231 Encounter for screening mammogram for malignant neoplasm of breast: Secondary | ICD-10-CM

## 2020-02-24 ENCOUNTER — Other Ambulatory Visit
Admission: RE | Admit: 2020-02-24 | Discharge: 2020-02-24 | Disposition: A | Discharge status: Home / Self Care | Payer: Medicare HMO | Source: Ambulatory Visit | Attending: Physician Assistant | Admitting: Physician Assistant

## 2020-02-24 DIAGNOSIS — Z79899 Other long term (current) drug therapy: Secondary | ICD-10-CM | POA: Insufficient documentation

## 2020-02-24 DIAGNOSIS — R0601 Orthopnea: Secondary | ICD-10-CM | POA: Insufficient documentation

## 2020-02-24 LAB — BRAIN NATRIURETIC PEPTIDE: B Natriuretic Peptide: 80 pg/mL (ref 0.0–100.0)

## 2020-03-09 ENCOUNTER — Ambulatory Visit
Admission: RE | Admit: 2020-03-09 | Discharge: 2020-03-09 | Disposition: A | Discharge status: Home / Self Care | Payer: Medicare HMO | Source: Ambulatory Visit | Attending: Internal Medicine | Admitting: Internal Medicine

## 2020-03-09 DIAGNOSIS — Z1231 Encounter for screening mammogram for malignant neoplasm of breast: Secondary | ICD-10-CM | POA: Diagnosis present

## 2020-08-17 ENCOUNTER — Emergency Department
Admission: EM | Admit: 2020-08-17 | Discharge: 2020-08-17 | Disposition: A | Discharge status: Home / Self Care | Payer: Medicare HMO | Attending: Emergency Medicine | Admitting: Emergency Medicine

## 2020-08-17 ENCOUNTER — Encounter: Payer: Self-pay | Admitting: *Deleted

## 2020-08-17 ENCOUNTER — Emergency Department: Payer: Medicare HMO

## 2020-08-17 ENCOUNTER — Other Ambulatory Visit: Payer: Self-pay

## 2020-08-17 DIAGNOSIS — M25511 Pain in right shoulder: Secondary | ICD-10-CM | POA: Insufficient documentation

## 2020-08-17 DIAGNOSIS — R079 Chest pain, unspecified: Secondary | ICD-10-CM | POA: Diagnosis present

## 2020-08-17 DIAGNOSIS — I10 Essential (primary) hypertension: Secondary | ICD-10-CM | POA: Insufficient documentation

## 2020-08-17 DIAGNOSIS — M549 Dorsalgia, unspecified: Secondary | ICD-10-CM | POA: Insufficient documentation

## 2020-08-17 DIAGNOSIS — I4891 Unspecified atrial fibrillation: Secondary | ICD-10-CM | POA: Insufficient documentation

## 2020-08-17 DIAGNOSIS — M25512 Pain in left shoulder: Secondary | ICD-10-CM | POA: Insufficient documentation

## 2020-08-17 DIAGNOSIS — R112 Nausea with vomiting, unspecified: Secondary | ICD-10-CM | POA: Diagnosis not present

## 2020-08-17 DIAGNOSIS — Z5321 Procedure and treatment not carried out due to patient leaving prior to being seen by health care provider: Secondary | ICD-10-CM | POA: Diagnosis not present

## 2020-08-17 LAB — BASIC METABOLIC PANEL
Anion gap: 9 (ref 5–15)
BUN: 17 mg/dL (ref 8–23)
CO2: 27 mmol/L (ref 22–32)
Calcium: 9.6 mg/dL (ref 8.9–10.3)
Chloride: 101 mmol/L (ref 98–111)
Creatinine, Ser: 1.06 mg/dL — ABNORMAL HIGH (ref 0.44–1.00)
GFR calc Af Amer: >60 mL/min (ref >60)
GFR calc non Af Amer: 55 mL/min — ABNORMAL LOW (ref >60)
Glucose, Bld: 165 mg/dL — ABNORMAL HIGH (ref 70–99)
Potassium: 4 mmol/L (ref 3.5–5.1)
Sodium: 137 mmol/L (ref 135–145)

## 2020-08-17 LAB — CBC
HCT: 40.7 % (ref 36.0–46.0)
Hemoglobin: 13.3 g/dL (ref 12.0–15.0)
MCH: 30.2 pg (ref 26.0–34.0)
MCHC: 32.7 g/dL (ref 30.0–36.0)
MCV: 92.5 fL (ref 80.0–100.0)
Platelets: 267 10*3/uL (ref 150–400)
RBC: 4.4 MIL/uL (ref 3.87–5.11)
RDW: 13.4 % (ref 11.5–15.5)
WBC: 12.6 10*3/uL — ABNORMAL HIGH (ref 4.0–10.5)
nRBC: 0 % (ref 0.0–0.2)

## 2020-08-17 LAB — TROPONIN I (HIGH SENSITIVITY)
Troponin I (High Sensitivity): 7 ng/L (ref <18)
Troponin I (High Sensitivity): 7 ng/L (ref <18)

## 2020-08-17 NOTE — ED Triage Notes (Signed)
Pt to triage via wheelchair.  Pt brought in via ems from home with chest pain.  Pain in shoulders and upper back.  No n/v/d.  pt alert  Speech clear.

## 2020-08-17 NOTE — ED Triage Notes (Signed)
EMS brought pt in from home for c/o CP accomp by N/V, hx afib, HTN

## 2020-08-17 NOTE — ED Notes (Signed)
Pt requesting to leave and will follow up with cardiac MD in the morning. Pt agreeable to redraw of troponin.

## 2020-10-19 ENCOUNTER — Other Ambulatory Visit: Payer: Self-pay | Admitting: Internal Medicine

## 2020-10-19 ENCOUNTER — Other Ambulatory Visit (HOSPITAL_COMMUNITY): Payer: Self-pay | Admitting: Internal Medicine

## 2020-10-19 DIAGNOSIS — R911 Solitary pulmonary nodule: Secondary | ICD-10-CM

## 2020-11-03 ENCOUNTER — Other Ambulatory Visit: Payer: Self-pay

## 2020-11-03 ENCOUNTER — Ambulatory Visit
Admission: RE | Admit: 2020-11-03 | Discharge: 2020-11-03 | Disposition: A | Discharge status: Home / Self Care | Payer: Medicare HMO | Source: Ambulatory Visit | Attending: Internal Medicine | Admitting: Internal Medicine

## 2020-11-03 DIAGNOSIS — R911 Solitary pulmonary nodule: Secondary | ICD-10-CM | POA: Insufficient documentation

## 2021-03-22 ENCOUNTER — Other Ambulatory Visit: Payer: Self-pay | Admitting: Internal Medicine

## 2021-03-22 DIAGNOSIS — Z1231 Encounter for screening mammogram for malignant neoplasm of breast: Secondary | ICD-10-CM

## 2021-03-29 ENCOUNTER — Other Ambulatory Visit: Payer: Self-pay

## 2021-03-29 ENCOUNTER — Ambulatory Visit
Admission: RE | Admit: 2021-03-29 | Discharge: 2021-03-29 | Disposition: A | Discharge status: Home / Self Care | Payer: Medicare HMO | Source: Ambulatory Visit | Attending: Internal Medicine | Admitting: Internal Medicine

## 2021-03-29 DIAGNOSIS — Z1231 Encounter for screening mammogram for malignant neoplasm of breast: Secondary | ICD-10-CM | POA: Diagnosis not present

## 2022-04-11 ENCOUNTER — Other Ambulatory Visit: Payer: Self-pay | Admitting: Internal Medicine

## 2022-04-11 DIAGNOSIS — Z1231 Encounter for screening mammogram for malignant neoplasm of breast: Secondary | ICD-10-CM

## 2022-04-26 ENCOUNTER — Ambulatory Visit
Admission: RE | Admit: 2022-04-26 | Discharge: 2022-04-26 | Disposition: A | Discharge status: Home / Self Care | Payer: Medicare HMO | Source: Ambulatory Visit | Attending: Internal Medicine | Admitting: Internal Medicine

## 2022-04-26 DIAGNOSIS — Z1231 Encounter for screening mammogram for malignant neoplasm of breast: Secondary | ICD-10-CM | POA: Insufficient documentation

## 2022-07-23 ENCOUNTER — Encounter: Payer: Self-pay | Admitting: Intensive Care

## 2022-07-23 ENCOUNTER — Other Ambulatory Visit: Payer: Self-pay

## 2022-07-23 ENCOUNTER — Emergency Department
Admission: EM | Admit: 2022-07-23 | Discharge: 2022-07-23 | Disposition: A | Discharge status: Home / Self Care | Payer: Medicare HMO | Attending: Emergency Medicine | Admitting: Emergency Medicine

## 2022-07-23 ENCOUNTER — Emergency Department: Payer: Medicare HMO

## 2022-07-23 DIAGNOSIS — I509 Heart failure, unspecified: Secondary | ICD-10-CM | POA: Insufficient documentation

## 2022-07-23 DIAGNOSIS — N200 Calculus of kidney: Secondary | ICD-10-CM | POA: Insufficient documentation

## 2022-07-23 DIAGNOSIS — I110 Hypertensive heart disease with heart failure: Secondary | ICD-10-CM | POA: Diagnosis not present

## 2022-07-23 DIAGNOSIS — R109 Unspecified abdominal pain: Secondary | ICD-10-CM | POA: Diagnosis present

## 2022-07-23 HISTORY — DX: Heart failure, unspecified: I50.9

## 2022-07-23 HISTORY — DX: Unspecified atrial fibrillation: I48.91

## 2022-07-23 LAB — URINALYSIS, ROUTINE W REFLEX MICROSCOPIC
Bilirubin Urine: NEGATIVE
Glucose, UA: NEGATIVE mg/dL
Ketones, ur: NEGATIVE mg/dL
Leukocytes,Ua: NEGATIVE
Nitrite: NEGATIVE
Protein, ur: NEGATIVE mg/dL
RBC / HPF: >50 RBC/hpf — ABNORMAL HIGH (ref 0–5)
Specific Gravity, Urine: 1.009 (ref 1.005–1.030)
pH: 5 (ref 5.0–8.0)

## 2022-07-23 LAB — CBC
HCT: 40.7 % (ref 36.0–46.0)
Hemoglobin: 13.1 g/dL (ref 12.0–15.0)
MCH: 29.6 pg (ref 26.0–34.0)
MCHC: 32.2 g/dL (ref 30.0–36.0)
MCV: 92.1 fL (ref 80.0–100.0)
Platelets: 233 10*3/uL (ref 150–400)
RBC: 4.42 MIL/uL (ref 3.87–5.11)
RDW: 13.2 % (ref 11.5–15.5)
WBC: 9.6 10*3/uL (ref 4.0–10.5)
nRBC: 0 % (ref 0.0–0.2)

## 2022-07-23 LAB — TROPONIN I (HIGH SENSITIVITY)
Troponin I (High Sensitivity): 7 ng/L (ref <18)
Troponin I (High Sensitivity): 7 ng/L (ref <18)

## 2022-07-23 LAB — BASIC METABOLIC PANEL
Anion gap: 10 (ref 5–15)
BUN: 11 mg/dL (ref 8–23)
CO2: 26 mmol/L (ref 22–32)
Calcium: 9.4 mg/dL (ref 8.9–10.3)
Chloride: 105 mmol/L (ref 98–111)
Creatinine, Ser: 1.15 mg/dL — ABNORMAL HIGH (ref 0.44–1.00)
GFR, Estimated: 52 mL/min — ABNORMAL LOW (ref >60)
Glucose, Bld: 148 mg/dL — ABNORMAL HIGH (ref 70–99)
Potassium: 3.8 mmol/L (ref 3.5–5.1)
Sodium: 141 mmol/L (ref 135–145)

## 2022-07-23 MED ORDER — ONDANSETRON 4 MG PO TBDP
4.0000 mg | ORAL_TABLET | Freq: Once | ORAL | Status: AC
  Administered 2022-07-23: 4 mg via ORAL
  Filled 2022-07-23: qty 1

## 2022-07-23 MED ORDER — OXYCODONE-ACETAMINOPHEN 5-325 MG PO TABS: 1.0000 | ORAL_TABLET | ORAL | 0 refills | Status: DC | PRN

## 2022-07-23 MED ORDER — CEPHALEXIN 500 MG PO CAPS: 500.0000 mg | ORAL_CAPSULE | Freq: Two times a day (BID) | ORAL | 0 refills | Status: DC

## 2022-07-23 MED ORDER — OXYCODONE-ACETAMINOPHEN 5-325 MG PO TABS
1.0000 | ORAL_TABLET | Freq: Once | ORAL | Status: AC
  Administered 2022-07-23: 1 via ORAL
  Filled 2022-07-23: qty 1

## 2022-07-23 MED ORDER — CEPHALEXIN 500 MG PO CAPS
500.0000 mg | ORAL_CAPSULE | Freq: Once | ORAL | Status: AC
  Administered 2022-07-23: 500 mg via ORAL
  Filled 2022-07-23: qty 1

## 2022-07-23 NOTE — ED Triage Notes (Signed)
Patient reports she was outside working and began having lower, mid back pain that radiates around to front and up to left breast.   Reports stress test X2 weeks ago was normal

## 2022-07-23 NOTE — ED Provider Notes (Signed)
[facility] Provider Note    Event Date/Time   First MD Initiated Contact with Patient 07/23/22 2141     (approximate)  History   Chief Complaint: Back Pain and Chest Pain  HPI  Amanda Holmes is a 71 y.o. female with a past medical history of atrial fibrillation, CHF, hypertension, presents emergency department for cute onset of flank pain earlier today.  According to the patient around 2 PM or so today she developed acute onset of left flank pain along with nausea.  Patient denies any history of kidney stones denies any dysuria or hematuria.  Since the initial 8/10 pain she states her discomfort has diminished significantly now down to a 3 or 4/10 no longer has nausea.  Physical Exam   Triage Vital Signs: ED Triage Vitals  Enc Vitals Group     BP 07/23/22 1520 (!) 158/59     Pulse Rate 07/23/22 1520 61     Resp 07/23/22 1520 16     Temp 07/23/22 1520 97.8 F (36.6 C)     Temp Source 07/23/22 1520 Oral     SpO2 07/23/22 1520 97 %     Weight 07/23/22 1516 240 lb (108.9 kg)     Height 07/23/22 1516 '5\' 4"'$  (1.626 m)     Head Circumference --      Peak Flow --      Pain Score 07/23/22 1516 10     Pain Loc --      Pain Edu? --      Excl. in [place]? --     Most recent vital signs: Vitals:   07/23/22 1520 07/23/22 1950  BP: (!) 158/59 (!) 150/71  Pulse: 61 65  Resp: 16 16  Temp: 97.8 F (36.6 C) 98.3 F (36.8 C)  SpO2: 97% 98%    General: Awake, no distress.  CV:  Good peripheral perfusion.  Regular rate and rhythm  Resp:  Normal effort.  Equal breath sounds bilaterally.  Abd:  No distention.  Soft, nontender.  No rebound or guarding.   ED Results / Procedures / Treatments   EKG  EKG viewed and interpreted by myself shows a normal sinus rhythm at 60 bpm with a narrow QRS, normal axis, normal intervals, no concerning ST changes.  RADIOLOGY  I have reviewed and interpreted the chest x-ray images I do not see any significant abnormality on  my evaluation. Radiology is read the chest x-ray is negative.   MEDICATIONS ORDERED IN ED: Medications  ondansetron (ZOFRAN-ODT) disintegrating tablet 4 mg (has no administration in time range)  oxyCODONE-acetaminophen (PERCOCET/ROXICET) 5-325 MG per tablet 1 tablet (has no administration in time range)     IMPRESSION / MDM / ASSESSMENT AND PLAN / ED COURSE  I reviewed the triage vital signs and the nursing notes.  Patient's presentation is most consistent with acute presentation with potential threat to life or bodily function.  Patient presents emergency department for left flank pain and nausea.  States the pain has diminished significantly from earlier today.  No history of kidney stones.  However lab work does show hematuria with no signs of urinary tract infection.  I will add on a urine culture.  A CTA shows partial obstruction of the left kidney but no visualized stone.  There is also 2 mm left renal stone.  Differential would still include past urinary stone or possible smaller stone in between CT slices.  Given the patient's urinalysis consistent with stone and CT consistent with  stone besides visualization of the stone I discussed with the patient follow-up with urology for recheck/reevaluation.  I discussed using a urinary strainer and capturing any stone that might pass.  Patient has a allergy to Celebrex we will avoid Toradol and treat the patient with Percocet.  Patient takes tramadol at home for pain I discussed with the patient to take Percocet instead of tramadol if needed for severe pain otherwise she can take her tramadol but not to take both.  Patient understands and is agreeable to this plan of care.  Remainder the lab work is reassuring with a negative troponin x2, reassuring chemistry and a reassuring CBC with a normal white blood cell count.  FINAL CLINICAL IMPRESSION(S) / ED DIAGNOSES   Left flank pain Kidney stone  Rx / DC Orders   Keflex Percocet  Note:  This  document was prepared using Dragon voice recognition software and may include unintentional dictation errors.   Harvest Dark, MD 07/23/22 2212

## 2022-07-23 NOTE — ED Provider Triage Note (Addendum)
Emergency Medicine Provider Triage Evaluation Note  Amanda Holmes , a 71 y.o. female  was evaluated in triage.  Pt complains of fatigue as well as some mid back and chest pain with radiation down to the left breast.  Patient with a history of A-fib with RVR, presents to the ED after she was in the yard working, began to experience some mid back and chest pain.  Patient denies any syncope, nausea, vomiting, diaphoresis.  Review of Systems  Positive: CP Negative: NV, syncope  Physical Exam  BP (!) 158/59 (BP Location: Left Arm)   Pulse 61   Temp 97.8 F (36.6 C) (Oral)   Resp 16   Ht '5\' 4"'$  (1.626 m)   Wt 108.9 kg   SpO2 97%   BMI 41.20 kg/m  Gen:   Awake, no distress  NAD Resp:  Normal effort CTA MSK:   Moves extremities without difficulty  CVS:  RRR  Medical Decision Making  Medically screening exam initiated at 3:43 PM.  Appropriate orders placed.  Amanda Holmes was informed that the remainder of the evaluation will be completed by another provider, this initial triage assessment does not replace that evaluation, and the importance of remaining in the ED until their evaluation is complete.  Patient to the ED with evaluation of chest pain while working out side.  She presents to the ED in no acute distress, for evaluation.   Melvenia Needles, PA-C 07/23/22 1545    Melvenia Needles, [place] 07/23/22 1818

## 2022-07-23 NOTE — Discharge Instructions (Addendum)
As we discussed please use a urine strainer if you capture the stone please place in a Ziploc bag.  Please take your antibiotic as written for its entire course.  Please take your pain medication as needed but only as prescribed.  As we discussed only take the pain medication instead of your tramadol if needed for severe pain.  Do not take both medications at once.  Return to the emergency department for any significant worsening of pain any fever or any other symptom personally concerning to yourself.  Please call the number provided to arrange a follow-up appointment with urology.

## 2022-07-25 LAB — URINE CULTURE: Culture: <10000 — AB

## 2023-03-28 ENCOUNTER — Other Ambulatory Visit: Payer: Self-pay | Admitting: Internal Medicine

## 2023-03-28 DIAGNOSIS — Z1231 Encounter for screening mammogram for malignant neoplasm of breast: Secondary | ICD-10-CM

## 2023-05-01 ENCOUNTER — Ambulatory Visit
Admission: RE | Admit: 2023-05-01 | Discharge: 2023-05-01 | Disposition: A | Discharge status: Home / Self Care | Payer: Medicare HMO | Source: Ambulatory Visit | Attending: Internal Medicine | Admitting: Internal Medicine

## 2023-05-01 DIAGNOSIS — Z1231 Encounter for screening mammogram for malignant neoplasm of breast: Secondary | ICD-10-CM | POA: Diagnosis present

## 2023-06-04 ENCOUNTER — Emergency Department: Payer: Medicare HMO

## 2023-06-04 ENCOUNTER — Inpatient Hospital Stay
Admission: EM | Admit: 2023-06-04 | Discharge: 2023-06-08 | DRG: 439 | Disposition: A | Discharge status: Home / Self Care | Payer: Medicare HMO | Attending: Internal Medicine | Admitting: Internal Medicine

## 2023-06-04 ENCOUNTER — Inpatient Hospital Stay: Payer: Medicare HMO

## 2023-06-04 ENCOUNTER — Other Ambulatory Visit: Payer: Self-pay

## 2023-06-04 DIAGNOSIS — I110 Hypertensive heart disease with heart failure: Secondary | ICD-10-CM | POA: Diagnosis present

## 2023-06-04 DIAGNOSIS — K859 Acute pancreatitis without necrosis or infection, unspecified: Secondary | ICD-10-CM | POA: Diagnosis present

## 2023-06-04 DIAGNOSIS — E876 Hypokalemia: Secondary | ICD-10-CM | POA: Diagnosis not present

## 2023-06-04 DIAGNOSIS — K219 Gastro-esophageal reflux disease without esophagitis: Secondary | ICD-10-CM | POA: Diagnosis present

## 2023-06-04 DIAGNOSIS — K851 Biliary acute pancreatitis without necrosis or infection: Secondary | ICD-10-CM | POA: Diagnosis present

## 2023-06-04 DIAGNOSIS — E119 Type 2 diabetes mellitus without complications: Secondary | ICD-10-CM | POA: Diagnosis not present

## 2023-06-04 DIAGNOSIS — Z79899 Other long term (current) drug therapy: Secondary | ICD-10-CM | POA: Diagnosis not present

## 2023-06-04 DIAGNOSIS — I10 Essential (primary) hypertension: Secondary | ICD-10-CM | POA: Diagnosis present

## 2023-06-04 DIAGNOSIS — Z85828 Personal history of other malignant neoplasm of skin: Secondary | ICD-10-CM | POA: Diagnosis not present

## 2023-06-04 DIAGNOSIS — I4891 Unspecified atrial fibrillation: Secondary | ICD-10-CM | POA: Diagnosis present

## 2023-06-04 DIAGNOSIS — R0789 Other chest pain: Secondary | ICD-10-CM | POA: Diagnosis present

## 2023-06-04 DIAGNOSIS — Z6841 Body Mass Index (BMI) 40.0 and over, adult: Secondary | ICD-10-CM

## 2023-06-04 DIAGNOSIS — Z803 Family history of malignant neoplasm of breast: Secondary | ICD-10-CM

## 2023-06-04 DIAGNOSIS — I5032 Chronic diastolic (congestive) heart failure: Secondary | ICD-10-CM | POA: Diagnosis present

## 2023-06-04 DIAGNOSIS — Z7901 Long term (current) use of anticoagulants: Secondary | ICD-10-CM | POA: Diagnosis not present

## 2023-06-04 DIAGNOSIS — E039 Hypothyroidism, unspecified: Secondary | ICD-10-CM | POA: Diagnosis present

## 2023-06-04 DIAGNOSIS — K8020 Calculus of gallbladder without cholecystitis without obstruction: Secondary | ICD-10-CM | POA: Diagnosis present

## 2023-06-04 DIAGNOSIS — Z886 Allergy status to analgesic agent status: Secondary | ICD-10-CM

## 2023-06-04 DIAGNOSIS — E1165 Type 2 diabetes mellitus with hyperglycemia: Secondary | ICD-10-CM | POA: Diagnosis present

## 2023-06-04 DIAGNOSIS — R079 Chest pain, unspecified: Principal | ICD-10-CM

## 2023-06-04 DIAGNOSIS — R1010 Upper abdominal pain, unspecified: Secondary | ICD-10-CM | POA: Diagnosis not present

## 2023-06-04 DIAGNOSIS — Z8349 Family history of other endocrine, nutritional and metabolic diseases: Secondary | ICD-10-CM | POA: Diagnosis not present

## 2023-06-04 DIAGNOSIS — Z79890 Hormone replacement therapy (postmenopausal): Secondary | ICD-10-CM

## 2023-06-04 DIAGNOSIS — Z880 Allergy status to penicillin: Secondary | ICD-10-CM

## 2023-06-04 LAB — GLUCOSE, CAPILLARY
Glucose-Capillary: 105 mg/dL — ABNORMAL HIGH (ref 70–99)
Glucose-Capillary: 111 mg/dL — ABNORMAL HIGH (ref 70–99)
Glucose-Capillary: 123 mg/dL — ABNORMAL HIGH (ref 70–99)

## 2023-06-04 LAB — CBC WITH DIFFERENTIAL/PLATELET
Abs Immature Granulocytes: 0.02 10*3/uL (ref 0.00–0.07)
Basophils Absolute: 0 10*3/uL (ref 0.0–0.1)
Basophils Relative: 0 %
Eosinophils Absolute: 0.1 10*3/uL (ref 0.0–0.5)
Eosinophils Relative: 1 %
HCT: 36.6 % (ref 36.0–46.0)
Hemoglobin: 11.7 g/dL — ABNORMAL LOW (ref 12.0–15.0)
Immature Granulocytes: 0 %
Lymphocytes Relative: 30 %
Lymphs Abs: 3 10*3/uL (ref 0.7–4.0)
MCH: 30.2 pg (ref 26.0–34.0)
MCHC: 32 g/dL (ref 30.0–36.0)
MCV: 94.3 fL (ref 80.0–100.0)
Monocytes Absolute: 0.8 10*3/uL (ref 0.1–1.0)
Monocytes Relative: 8 %
Neutro Abs: 6.2 10*3/uL (ref 1.7–7.7)
Neutrophils Relative %: 61 %
Platelets: 192 10*3/uL (ref 150–400)
RBC: 3.88 MIL/uL (ref 3.87–5.11)
RDW: 13.2 % (ref 11.5–15.5)
WBC: 10 10*3/uL (ref 4.0–10.5)
nRBC: 0 % (ref 0.0–0.2)

## 2023-06-04 LAB — COMPREHENSIVE METABOLIC PANEL
ALT: 41 U/L (ref 0–44)
ALT: 36 U/L (ref 0–44)
AST: 81 U/L — ABNORMAL HIGH (ref 15–41)
AST: 58 U/L — ABNORMAL HIGH (ref 15–41)
Albumin: 4.1 g/dL (ref 3.5–5.0)
Albumin: 3.5 g/dL (ref 3.5–5.0)
Alkaline Phosphatase: 139 U/L — ABNORMAL HIGH (ref 38–126)
Alkaline Phosphatase: 124 U/L (ref 38–126)
Anion gap: 7 (ref 5–15)
Anion gap: 5 (ref 5–15)
BUN: 18 mg/dL (ref 8–23)
BUN: 15 mg/dL (ref 8–23)
CO2: 26 mmol/L (ref 22–32)
CO2: 27 mmol/L (ref 22–32)
Calcium: 9.1 mg/dL (ref 8.9–10.3)
Calcium: 8.6 mg/dL — ABNORMAL LOW (ref 8.9–10.3)
Chloride: 108 mmol/L (ref 98–111)
Chloride: 109 mmol/L (ref 98–111)
Creatinine, Ser: 0.85 mg/dL (ref 0.44–1.00)
Creatinine, Ser: 0.75 mg/dL (ref 0.44–1.00)
GFR, Estimated: >60 mL/min (ref >60)
GFR, Estimated: >60 mL/min (ref >60)
Glucose, Bld: 243 mg/dL — ABNORMAL HIGH (ref 70–99)
Glucose, Bld: 142 mg/dL — ABNORMAL HIGH (ref 70–99)
Potassium: 3.9 mmol/L (ref 3.5–5.1)
Potassium: 3.6 mmol/L (ref 3.5–5.1)
Sodium: 141 mmol/L (ref 135–145)
Sodium: 141 mmol/L (ref 135–145)
Total Bilirubin: 1.3 mg/dL — ABNORMAL HIGH (ref 0.3–1.2)
Total Bilirubin: 0.7 mg/dL (ref 0.3–1.2)
Total Protein: 7.4 g/dL (ref 6.5–8.1)
Total Protein: 6.4 g/dL — ABNORMAL LOW (ref 6.5–8.1)

## 2023-06-04 LAB — TROPONIN I (HIGH SENSITIVITY)
Troponin I (High Sensitivity): 8 ng/L (ref <18)
Troponin I (High Sensitivity): 8 ng/L (ref <18)

## 2023-06-04 LAB — LACTIC ACID, PLASMA
Lactic Acid, Venous: 1 mmol/L (ref 0.5–1.9)
Lactic Acid, Venous: 1 mmol/L (ref 0.5–1.9)

## 2023-06-04 LAB — CBG MONITORING, ED
Glucose-Capillary: 140 mg/dL — ABNORMAL HIGH (ref 70–99)
Glucose-Capillary: 119 mg/dL — ABNORMAL HIGH (ref 70–99)

## 2023-06-04 LAB — CBC
HCT: 39.4 % (ref 36.0–46.0)
Hemoglobin: 12.8 g/dL (ref 12.0–15.0)
MCH: 30.3 pg (ref 26.0–34.0)
MCHC: 32.5 g/dL (ref 30.0–36.0)
MCV: 93.1 fL (ref 80.0–100.0)
Platelets: 216 10*3/uL (ref 150–400)
RBC: 4.23 MIL/uL (ref 3.87–5.11)
RDW: 13.1 % (ref 11.5–15.5)
WBC: 9.4 10*3/uL (ref 4.0–10.5)
nRBC: 0 % (ref 0.0–0.2)

## 2023-06-04 LAB — PROTIME-INR
INR: 1 (ref 0.8–1.2)
Prothrombin Time: 13.7 seconds (ref 11.4–15.2)

## 2023-06-04 LAB — HEMOGLOBIN A1C
Hgb A1c MFr Bld: 7.1 % — ABNORMAL HIGH (ref 4.8–5.6)
Mean Plasma Glucose: 157.07 mg/dL

## 2023-06-04 LAB — LIPASE, BLOOD: Lipase: 3531 U/L — ABNORMAL HIGH (ref 11–51)

## 2023-06-04 LAB — TSH: TSH: 1.326 u[IU]/mL (ref 0.350–4.500)

## 2023-06-04 LAB — HIV ANTIBODY (ROUTINE TESTING W REFLEX): HIV Screen 4th Generation wRfx: NONREACTIVE

## 2023-06-04 MED ORDER — INSULIN ASPART 100 UNIT/ML IJ SOLN
0.0000 [IU] | Freq: Three times a day (TID) | INTRAMUSCULAR | Status: DC
  Administered 2023-06-05: 1 [IU] via SUBCUTANEOUS
  Filled 2023-06-04: qty 1

## 2023-06-04 MED ORDER — IOHEXOL 350 MG/ML SOLN
100.0000 mL | Freq: Once | INTRAVENOUS | Status: AC | PRN
  Administered 2023-06-04: 100 mL via INTRAVENOUS

## 2023-06-04 MED ORDER — HEPARIN SODIUM (PORCINE) 5000 UNIT/ML IJ SOLN
5000.0000 [IU] | Freq: Three times a day (TID) | INTRAMUSCULAR | Status: AC
  Administered 2023-06-04 – 2023-06-06 (×7): 5000 [IU] via SUBCUTANEOUS
  Filled 2023-06-04 (×7): qty 1

## 2023-06-04 MED ORDER — ONDANSETRON HCL 4 MG/2ML IJ SOLN
4.0000 mg | Freq: Once | INTRAMUSCULAR | Status: AC
  Administered 2023-06-04: 4 mg via INTRAVENOUS
  Filled 2023-06-04: qty 2

## 2023-06-04 MED ORDER — ACETAMINOPHEN 650 MG RE SUPP: 650.0000 mg | Freq: Four times a day (QID) | RECTAL | Status: DC | PRN

## 2023-06-04 MED ORDER — ONDANSETRON HCL 4 MG PO TABS
4.0000 mg | ORAL_TABLET | Freq: Four times a day (QID) | ORAL | Status: DC | PRN
  Administered 2023-06-05: 4 mg via ORAL
  Filled 2023-06-04: qty 1

## 2023-06-04 MED ORDER — OXYCODONE HCL 5 MG PO TABS
5.0000 mg | ORAL_TABLET | ORAL | Status: DC | PRN
  Administered 2023-06-04 – 2023-06-07 (×6): 5 mg via ORAL
  Filled 2023-06-04 (×6): qty 1

## 2023-06-04 MED ORDER — HYDROMORPHONE HCL 1 MG/ML IJ SOLN
0.5000 mg | INTRAMUSCULAR | Status: DC | PRN
  Administered 2023-06-04: 0.5 mg via INTRAVENOUS
  Filled 2023-06-04: qty 0.5

## 2023-06-04 MED ORDER — LACTATED RINGERS IV SOLN: INTRAVENOUS | Status: AC

## 2023-06-04 MED ORDER — SODIUM CHLORIDE 0.9 % IV BOLUS
500.0000 mL | Freq: Once | INTRAVENOUS | Status: AC
  Administered 2023-06-04: 500 mL via INTRAVENOUS

## 2023-06-04 MED ORDER — ONDANSETRON HCL 4 MG/2ML IJ SOLN
4.0000 mg | Freq: Four times a day (QID) | INTRAMUSCULAR | Status: DC | PRN
  Administered 2023-06-04 – 2023-06-07 (×3): 4 mg via INTRAVENOUS
  Filled 2023-06-04 (×3): qty 2

## 2023-06-04 MED ORDER — ACETAMINOPHEN 325 MG PO TABS
650.0000 mg | ORAL_TABLET | Freq: Four times a day (QID) | ORAL | Status: DC | PRN
  Administered 2023-06-05 – 2023-06-07 (×3): 650 mg via ORAL
  Filled 2023-06-04 (×5): qty 2

## 2023-06-04 MED ORDER — MORPHINE SULFATE (PF) 4 MG/ML IV SOLN
4.0000 mg | Freq: Once | INTRAVENOUS | Status: AC
  Administered 2023-06-04: 4 mg via INTRAVENOUS
  Filled 2023-06-04: qty 1

## 2023-06-04 MED ORDER — ALBUTEROL SULFATE (2.5 MG/3ML) 0.083% IN NEBU: 2.5000 mg | INHALATION_SOLUTION | RESPIRATORY_TRACT | Status: DC | PRN

## 2023-06-04 MED ORDER — MORPHINE SULFATE (PF) 4 MG/ML IV SOLN
4.0000 mg | INTRAVENOUS | Status: DC | PRN
  Administered 2023-06-04: 4 mg via INTRAVENOUS
  Filled 2023-06-04: qty 1

## 2023-06-04 NOTE — ED Provider Notes (Addendum)
[facility] Provider Note    Event Date/Time   First MD Initiated Contact with Patient 06/04/23 [phone]     (approximate)   History   Chest Pain, Abdominal Pain, and Back Pain   HPI  Amanda Holmes is a 72 y.o. female   Past medical history of A-fib on sotalol, off anticoagulation, diastolic heart failure, GERD, hypertension, hypothyroid, here with chest pain.  Onset approximately 10 PM last night after having a dinner party.  Has otherwise been in her regular state of health, had gas over for dinner, and as they were leaving developed substernal chest pain.  Radiates behind her upper back.  Some shortness of breath, worsens the chest pain with deep breath.  She denies any respiratory infectious symptoms like cough, fever.  She denies any vomiting, diarrhea, urinary symptoms.  She has never had abdominal surgeries.  Independent Historian contributed to assessment above: Been is at bedside corroborates information given above.  External Medical Documents Reviewed: Dr. Darrold Junker cardiology note from May 2024 documenting her past cardiac workups, atrial fibrillation on sotalol and noting that she is off of anticoagulation currently per patient choice.      Physical Exam   Triage Vital Signs: ED Triage Vitals  Enc Vitals Group     BP 06/04/23 0419 (!) 169/89     Pulse Rate 06/04/23 0419 77     Resp 06/04/23 0419 18     Temp 06/04/23 0419 98.5 F (36.9 C)     Temp Source 06/04/23 0419 Oral     SpO2 06/04/23 0419 96 %     Weight 06/04/23 0418 246 lb (111.6 kg)     Height 06/04/23 0418 5\' 4"  (1.626 m)     Head Circumference --      Peak Flow --      Pain Score 06/04/23 0418 8     Pain Loc --      Pain Edu? --      Excl. in [place]? --     Most recent vital signs: Vitals:   06/04/23 0500 06/04/23 0530  BP: (!) 181/71 (!) 181/78  Pulse: 74 70  Resp: 13 11  Temp:    SpO2: 97% 92%    General: Awake, no distress.  CV:  Good peripheral perfusion.   Resp:  Normal effort.  Abd:  No distention.  Other:  hypertensive otherwise vital signs within normal limits, afebrile, no hypoxemia.  Lungs clear and her abdomen has tenderness to the right upper quadrant epigastrium and left upper quadrant without rigidity or guarding.   ED Results / Procedures / Treatments   Labs (all labs ordered are listed, but only abnormal results are displayed) Labs Reviewed  COMPREHENSIVE METABOLIC PANEL - Abnormal; Notable for the following components:      Result Value   Glucose, Bld 243 (*)    AST 81 (*)    Alkaline Phosphatase 139 (*)    Total Bilirubin 1.3 (*)    All other components within normal limits  LIPASE, BLOOD - Abnormal; Notable for the following components:   Lipase 3,531 (*)    All other components within normal limits  CBC  PROTIME-INR  TROPONIN I (HIGH SENSITIVITY)  TROPONIN I (HIGH SENSITIVITY)     I ordered and reviewed the above labs they are notable for white blood cell count is normal initial troponin is 8  EKG  ED ECG REPORT I, Pilar Jarvis, the attending physician, personally viewed and interpreted this ECG.  Date: 06/04/2023  EKG Time: 0416  Rate: 81  Rhythm: nsr  Axis: nl  Intervals:none  ST&T Change: no stemi    RADIOLOGY I independently reviewed and interpreted chest x-ray and I see no obvious focality or pneumothorax   PROCEDURES:  Critical Care performed: No  Procedures   MEDICATIONS ORDERED IN ED: Medications  sodium chloride 0.9 % bolus 500 mL (has no administration in time range)  morphine (PF) 4 MG/ML injection 4 mg (has no administration in time range)  morphine (PF) 4 MG/ML injection 4 mg (4 mg Intravenous Given 06/04/23 0520)  ondansetron (ZOFRAN) injection 4 mg (4 mg Intravenous Given 06/04/23 0520)  iohexol (OMNIPAQUE) 350 MG/ML injection 100 mL (100 mLs Intravenous Contrast Given 06/04/23 0540)      IMPRESSION / MDM / ASSESSMENT AND PLAN / ED COURSE  I reviewed the triage vital signs  and the nursing notes.                                Patient's presentation is most consistent with acute presentation with potential threat to life or bodily function.  Differential diagnosis includes, but is not limited to, ACS, PE, dissection, respiratory infection, bacterial pneumonia, pneumothorax, costochondritis, cholecystitis, cholelithiasis, pancreatitis, gastritis/dyspepsia, appendicitis, obstruction   The patient is on the cardiac monitor to evaluate for evidence of arrhythmia and/or significant heart rate changes.  MDM:     Broad differential diagnosis for this patient with chest/upper abdominal pain acute onset with radiation to the back.  Consider cardiopulmonary pathologies like ACS, PE especially in a patient with a history of A-fib now off anticoagulation.  She is in normal sinus rhythm today, but concern for PE given pleuritic nature of chest pain and associated some shortness of breath.  More suspicious however for intra-abdominal pathologies like cystitis or biliary colic given right upper quadrant tenderness to palpation postprandial pain with radiation to the back.    Given this differential diagnosis we will get basic labs including LFTs, lipase, troponins, EKG and a CT angiogram of the chest as well as a CT abdomen pelvis with IV contrast.  --- Markedly elevated lipase in the thousands consistent with pancreatitis and imaging with some inflammatory changes around the pancreas concerning for the same.  Small gallstones in the gallbladder without evidence of acute cholecystitis and no evidence of biliary dilation on the CT scan.  Suspect gallstone pancreatitis, will further assess with right upper quadrant ultrasound, spoke w surgery consultation Dr Aleen Campi - plan for medical admit for gallstone pancreatitis.       FINAL CLINICAL IMPRESSION(S) / ED DIAGNOSES   Final diagnoses:  Nonspecific chest pain  Upper abdominal pain  Acute biliary pancreatitis,  unspecified complication status  Gallstones     Rx / DC Orders   ED Discharge Orders     None        Note:  This document was prepared using Dragon voice recognition software and may include unintentional dictation errors.    Pilar Jarvis, MD 06/04/23 1610    Pilar Jarvis, MD 06/04/23 [phone]

## 2023-06-04 NOTE — H&P (Addendum)
History and Physical    Amanda Holmes RWE:[id number] DOB: [birth year] DOA: 06/04/2023  PCP: Lynnea Ferrier, MD  Patient coming from: home  I have personally briefly reviewed patient's old medical records in [facility]  Chief Complaint: epigastric pain/chest pain  HPI: Amanda Holmes is a 72 y.o. female with medical history significant of Atrial fibrillation not on OAC, type II diabetes, obesity, essential hypertension,GERD, hypothyroidism, CHFpef who presents to ED with  Substernal chest pain as well as epigastric pain with radiation to her back and shoulder blade. Patient notes symptoms are worsening pain with deep  breathing. Patient notes no fever / no cough / and noted pain started acutely around 3 hours after having dinner. Patient states pain is still persistent 5/10 centered in epigastric and right upper abdominal area. She notes no emesis since last evening. She denies chest pain currently or sob.  She notes no prior episode like this in the past,although does notes episodes of GERD from time to time.  ED Course:  Afeb, bp 169/89, hr 77, rr 18  sat 96% on ra  Wbc 9.4, hgb 12.8, plt 216  INR 1 CE 8 NA141, K 3.9, CL108, glu 243, cr 0.85  AST 81  Alkphos 139  Tbili 1.3 Lipase 3531  EKG NSR IMPRESSION: No evidence of active disease. RUQ u/s: IMPRESSION: 1. Cholelithiasis without evidence of cholecystitis. 2. Borderline CBD dilatation without visible choledocholithiasis.   CTAB/pelvis IMPRESSION: 1. Acute edematous pancreatitis versus mesenteric panniculitis, correlate with serum enzymes. 2. Cholelithiasis. Trop 8  tx zofran 4mg   morphine 4mg   Review of Systems: As per HPI otherwise 10 point review of systems negative.   Past Medical History:  Diagnosis Date   Atrial fibrillation (HCC)    Cancer (HCC)    skin ca squamous cell   CHF (congestive heart failure) (HCC)    GERD (gastroesophageal reflux disease)    Hypertension    Hypothyroidism    PONV  (postoperative nausea and vomiting)     Past Surgical History:  Procedure Laterality Date   DILATION AND CURETTAGE OF UTERUS N/A 09/23/2016   Procedure: DILATATION AND CURETTAGE AND HYSTEROSCOPY;  Surgeon: Christeen Douglas, MD;  Location: [facility] ORS;  Service: Gynecology;  Laterality: N/A;   GANGLION CYST EXCISION     KNEE ARTHROPLASTY Left 08/23/2017   Procedure: COMPUTER ASSISTED TOTAL KNEE ARTHROPLASTY;  Surgeon: Donato Heinz, MD;  Location: [facility] ORS;  Service: Orthopedics;  Laterality: Left;   TUBAL LIGATION       reports that she has never smoked. She has never used smokeless tobacco. She reports that she does not drink alcohol and does not use drugs.  Allergies  Allergen Reactions   Celebrex [Celecoxib] Anaphylaxis and Shortness Of Breath    Family History  Problem Relation Age of Onset   Breast cancer Sister 58  Thyroid disease,MI-Mother   Prior to Admission medications   Medication Sig Start Date End Date Taking? Authorizing Provider  acetaminophen (TYLENOL) 500 MG tablet Take 500-1,000 mg by mouth 2 (two) times daily. 2 tabs in the am, 1 tab at bedtime    [provider]  azelastine (ASTELIN) 0.1 % nasal spray Place 1 spray into both nostrils 2 (two) times daily. Use in each nostril as directed    [provider]  cephALEXin (KEFLEX) 500 MG capsule Take 1 capsule (500 mg total) by mouth 2 (two) times daily. 07/23/22   Minna Antis, MD  DULoxetine (CYMBALTA) 30 MG capsule Take 30 mg  by mouth daily.    [provider]  hydrOXYzine (ATARAX/VISTARIL) 10 MG tablet Take 1-2 tablets by mouth 3 (three) times daily as needed. 10/18/19   [provider]  levothyroxine (SYNTHROID) 100 MCG tablet Take 1 tablet (100 mcg total) by mouth daily before breakfast. 10/23/19   Swayze, Ava, DO  loratadine (CLARITIN) 10 MG tablet Take 10 mg by mouth daily.    [provider]  losartan (COZAAR) 100 MG tablet Take 100 mg by mouth daily. 10/12/19    [provider]  nystatin cream (MYCOSTATIN) Apply 1 application topically 2 (two) times daily.    [provider]  oxyCODONE-acetaminophen (PERCOCET) 5-325 MG tablet Take 1 tablet by mouth every 4 (four) hours as needed for severe pain. 07/23/22   Minna Antis, MD  pantoprazole (PROTONIX) 40 MG tablet Take 40 mg by mouth 2 (two) times daily.    [provider]  rivaroxaban (XARELTO) 20 MG TABS tablet Take 1 tablet (20 mg total) by mouth daily with supper. 10/22/19   Swayze, Ava, DO  sotalol (BETAPACE) 80 MG tablet Take 1 tablet (80 mg total) by mouth daily. 10/23/19   Swayze, Ava, DO    Physical Exam: Vitals:   06/04/23 0419 06/04/23 0500 06/04/23 0530 06/04/23 0700  BP: (!) 169/89 (!) 181/71 (!) 181/78 (!) 179/77  Pulse: 77 74 70 69  Resp: 18 13 11 13   Temp: 98.5 F (36.9 C)     TempSrc: Oral     SpO2: 96% 97% 92% 91%  Weight:      Height:        Constitutional: NAD, calm, comfortable Vitals:   06/04/23 0419 06/04/23 0500 06/04/23 0530 06/04/23 0700  BP: (!) 169/89 (!) 181/71 (!) 181/78 (!) 179/77  Pulse: 77 74 70 69  Resp: 18 13 11 13   Temp: 98.5 F (36.9 C)     TempSrc: Oral     SpO2: 96% 97% 92% 91%  Weight:      Height:       Eyes: PERRL, lids and conjunctivae normal ENMT: Mucous membranes are moist. Posterior pharynx clear of any exudate or lesions.Normal dentition.  Neck: normal, supple, no masses, no thyromegaly Respiratory: clear to auscultation bilaterally, no wheezing, no crackles. Normal respiratory effort. No accessory muscle use.  Cardiovascular: Regular rate and rhythm, no murmurs / rubs / gallops. No extremity edema. 2+ pedal pulses. N Abdomen: obese,RUQ/epigastric tenderness, no masses palpated. No hepatosplenomegaly. Bowel sounds positive.  Musculoskeletal: no clubbing / cyanosis. No joint deformity upper and lower extremities. Good ROM, no contractures. Normal muscle tone.  Skin: no rashes, lesions, ulcers. No  induration Neurologic: CN 2-12 grossly intact. Sensation intact. Strength 5/5 in all 4.  Psychiatric: Normal judgment and insight. Alert and oriented x 3. Normal mood.    Labs on Admission: I have personally reviewed following labs and imaging studies  CBC: Recent Labs  Lab 06/04/23 0425  WBC 9.4  HGB 12.8  HCT 39.4  MCV 93.1  PLT 216   Basic Metabolic Panel: Recent Labs  Lab 06/04/23 0425  NA 141  K 3.9  CL 108  CO2 26  GLUCOSE 243*  BUN 18  CREATININE 0.85  CALCIUM 9.1   GFR: Estimated Creatinine Clearance: 76.4 mL/min (by C-G formula based on SCr of 0.85 mg/dL). Liver Function Tests: Recent Labs  Lab 06/04/23 0425  AST 81*  ALT 41  ALKPHOS 139*  BILITOT 1.3*  PROT 7.4  ALBUMIN 4.1   Recent Labs  Lab 06/04/23  0425  LIPASE 3,531*   No results for input(s): "AMMONIA" in the last 168 hours. Coagulation Profile: Recent Labs  Lab 06/04/23 0425  INR 1.0   Cardiac Enzymes: No results for input(s): "CKTOTAL", "CKMB", "CKMBINDEX", "TROPONINI" in the last 168 hours. BNP (last 3 results) No results for input(s): "PROBNP" in the last 8760 hours. HbA1C: No results for input(s): "HGBA1C" in the last 72 hours. CBG: No results for input(s): "GLUCAP" in the last 168 hours. Lipid Profile: No results for input(s): "CHOL", "HDL", "LDLCALC", "TRIG", "CHOLHDL", "LDLDIRECT" in the last 72 hours. Thyroid Function Tests: No results for input(s): "TSH", "T4TOTAL", "FREET4", "T3FREE", "THYROIDAB" in the last 72 hours. Anemia Panel: No results for input(s): "VITAMINB12", "FOLATE", "FERRITIN", "TIBC", "IRON", "RETICCTPCT" in the last 72 hours. Urine analysis:    Component Value Date/Time   COLORURINE YELLOW (A) 07/23/2022 1732   APPEARANCEUR HAZY (A) 07/23/2022 1732   LABSPEC 1.009 07/23/2022 1732   PHURINE 5.0 07/23/2022 1732   GLUCOSEU NEGATIVE 07/23/2022 1732   HGBUR LARGE (A) 07/23/2022 1732   BILIRUBINUR NEGATIVE 07/23/2022 1732   KETONESUR NEGATIVE 07/23/2022  1732   PROTEINUR NEGATIVE 07/23/2022 1732   NITRITE NEGATIVE 07/23/2022 1732   LEUKOCYTESUR NEGATIVE 07/23/2022 1732    Radiological Exams on Admission: US ABDOMEN LIMITED RUQ (LIVER/GB)  Result Date: 06/04/2023 CLINICAL DATA:  Pancreatitis with upper abdominal pain EXAM: ULTRASOUND ABDOMEN LIMITED RIGHT UPPER QUADRANT COMPARISON:  Abdominal CT from earlier today FINDINGS: Gallbladder: Layering calcification for multiple small calculi. Cholesterolosis with ring down artifact at the anterior wall of the gallbladder. No focal tenderness or pericholecystic edema. Common bile duct: Diameter: 8 mm, borderline dilated for age. Where visualized, no filling defect. Liver: No focal lesion identified. Within normal limits in parenchymal echogenicity. Portal vein is patent on color Doppler imaging with normal direction of blood flow towards the liver. IMPRESSION: 1. Cholelithiasis without evidence of cholecystitis. 2. Borderline CBD dilatation without visible choledocholithiasis. Electronically Signed   By: Tiburcio Pea M.D.   On: 06/04/2023 07:32   CT Angio Chest PE W/Cm &/Or Wo Cm  Result Date: 06/04/2023 CLINICAL DATA:  Epigastric chest pain that radiates to the left breast into her back between the shoulder blades. EXAM: CT ANGIOGRAPHY CHEST WITH CONTRAST TECHNIQUE: Multidetector CT imaging of the chest was performed using the standard protocol during bolus administration of intravenous contrast. Multiplanar CT image reconstructions and MIPs were obtained to evaluate the vascular anatomy. RADIATION DOSE REDUCTION: This exam was performed according to the departmental dose-optimization program which includes automated exposure control, adjustment of the mA and/or kV according to patient size and/or use of iterative reconstruction technique. CONTRAST:  OMNIPAQUE IOHEXOL 350 MG/ML SOLN COMPARISON:  11/03/2020 FINDINGS: Cardiovascular: The heart size is normal. No substantial pericardial effusion. Mild  atherosclerotic calcification is noted in the wall of the thoracic aorta. There is no filling defect within the opacified pulmonary arteries to suggest the presence of an acute pulmonary embolus. Mediastinum/Nodes: No mediastinal lymphadenopathy. There is no hilar lymphadenopathy. The esophagus has normal imaging features. There is no axillary lymphadenopathy. Lungs/Pleura: No suspicious pulmonary nodule or mass. No focal airspace consolidation. No pleural effusion. Upper Abdomen: Subtle irregularity of liver contour raises the question of cirrhosis. Layering tiny calcified gallstones evident. Subtle edema/stranding is noted anterior to the pancreas and extending into the transverse mesocolon. No main duct dilatation the pancreas. Pancreatic parenchyma incompletely visualized. Musculoskeletal: No worrisome lytic or sclerotic osseous abnormality. Review of the MIP images confirms the above findings. IMPRESSION: 1. No CT evidence  for acute pulmonary embolus. 2. Subtle edema/stranding anterior to the pancreas and extending into the transverse mesocolon. Imaging features are concerning for acute pancreatitis. Correlation with amylase/lipase may prove helpful. 3. Subtle irregularity of liver contour raises the question of cirrhosis. 4. Cholelithiasis. 5.  Aortic Atherosclerosis (ICD10-I70.0). Electronically Signed   By: Kennith Center M.D.   On: 06/04/2023 06:02   CT ABDOMEN PELVIS W CONTRAST  Result Date: 06/04/2023 CLINICAL DATA:  Acute, nonlocalized abdominal pain EXAM: CT ABDOMEN AND PELVIS WITH CONTRAST TECHNIQUE: Multidetector CT imaging of the abdomen and pelvis was performed using the standard protocol following bolus administration of intravenous contrast. RADIATION DOSE REDUCTION: This exam was performed according to the departmental dose-optimization program which includes automated exposure control, adjustment of the mA and/or kV according to patient size and/or use of iterative reconstruction technique.  CONTRAST:  OMNIPAQUE IOHEXOL 350 MG/ML SOLN COMPARISON:  Renal stone CT 07/23/2022 FINDINGS: Lower chest:  No contributory findings. Hepatobiliary: No focal liver abnormality.Layering gallstones. No evidence of acute cholecystitis. No biliary dilatation Pancreas: Fat stranding at the root of the small bowel mesentery. The pancreas has an indistinct margination and slight diffuse fullness. No necrosis or collection. Spleen: Unremarkable. Adrenals/Urinary Tract: Negative adrenals. No hydronephrosis or stone. Unremarkable bladder. Stomach/Bowel:  No obstruction. No appendicitis. Vascular/Lymphatic: No acute vascular abnormality. Fat stranding at the root of the small bowel mesentery. No mass or adenopathy. Reproductive:No pathologic findings. Other: No ascites or pneumoperitoneum. Musculoskeletal: No acute abnormalities. IMPRESSION: 1. Acute edematous pancreatitis versus mesenteric panniculitis, correlate with serum enzymes. 2. Cholelithiasis. Electronically Signed   By: Tiburcio Pea M.D.   On: 06/04/2023 06:00   DG Chest Port 1 View  Result Date: 06/04/2023 CLINICAL DATA:  Chest pain EXAM: PORTABLE CHEST 1 VIEW COMPARISON:  07/23/2022 FINDINGS: Normal heart size and mediastinal contours for portable technique. Density at the right apex is from costochondral junction and stable from prior. There is no edema, consolidation, effusion, or pneumothorax. IMPRESSION: No evidence of active disease. Electronically Signed   By: Tiburcio Pea M.D.   On: 06/04/2023 04:58    EKG: Independently reviewed. See above   Assessment/Plan  Acute Gallstone Pancreatitis  -GBD dilation with  concern for retained stones  -admit to progressive  - mrcp no retained stone  -  no further gi consult need at this time per Dr Servando Snare  - Dr Aleen Campi as evaluated patient and is considering cholecystectomy this admission-full recs pending -npo for possible surgical intervention  - ivfs/ antiemetic/pain medications supportively    Atrial fibrillation  -not on OAC -continue rate control   type II diabetes -diet controlled  -noted to have hyperglycemia currently  - last A1c 7.1 -will starte is/fs    Essential hypertension -prn iv medication if patient not able to tolerate po  -resume po as able  GERD -ppi    Hypothyroidism -resume synthroid   CHFpef -ef  65% -well compensated  - monitor I/o daily weight    Obesity  DVT prophylaxis: heparin Code Status: full/ as discussed per patient wishes in event of cardiac arrest Family Communication: none at bedside  Disposition Plan: patient  expected to be admitted greater than 2 midnights  Consults called: Surgery Dr Aleen Campi  Admission status:progressive care    Lurline Del MD [facility]   If 7PM-7AM, please contact night-coverage [email or web address] Password [place] [place]  06/04/2023, 8:39 AM

## 2023-06-04 NOTE — Consult Note (Signed)
Date of Consultation:  06/04/2023  Requesting Physician:  Pilar Jarvis, MD  Reason for Consultation:  Gallstone pancreatitis  History of Present Illness: Amanda Holmes is a 72 y.o. female presenting to the ED overnight with high epigastric abdominal pain that started last night after dinner.  The pain radiated to her upper back.  Denies any nausea or vomiting, fever or chills.  She reports having some milder episodes of similar pain location which would improve after about 2 days with taking antiacids.  She tried antiacid last night but it did not improve at all.  In the ED, her workup showed elevated lipase of 3531, mildly elevated LFTs with total bili of 1.3, AST 81, ALT 41, Alk Phos 139.  On prior labwork two weeks ago, her total bili was 0.4 and AST 17, ALT 13, and Alk Phos 113.  She had a CTA chest which was negative for PE.  Her CT abdomen/pelvis showed edema of the pancreatic head and body concerning for pancreatitis and also cholelithiasis.  Her RUQ ultrasound did not reveal cholecystitis, but her CBD measured 8 mm.  MRCP confirmed her pancreatitis without any complicating factors like pseudocyst or necrosis, and did not show any choledocholithiasis.  I have personally viewed all her images and agree with the findings.  Of note, she has history of atrial fibrillation but is not on any anticoagulation per patient choice.  She also does not drink alcohol.  Past Medical History: Past Medical History:  Diagnosis Date   Atrial fibrillation (HCC)    Cancer (HCC)    skin ca squamous cell   CHF (congestive heart failure) (HCC)    GERD (gastroesophageal reflux disease)    Hypertension    Hypothyroidism    PONV (postoperative nausea and vomiting)      Past Surgical History: Past Surgical History:  Procedure Laterality Date   DILATION AND CURETTAGE OF UTERUS N/A 09/23/2016   Procedure: DILATATION AND CURETTAGE AND HYSTEROSCOPY;  Surgeon: Christeen Douglas, MD;  Location: [facility] ORS;  Service:  Gynecology;  Laterality: N/A;   GANGLION CYST EXCISION     KNEE ARTHROPLASTY Left 08/23/2017   Procedure: COMPUTER ASSISTED TOTAL KNEE ARTHROPLASTY;  Surgeon: Donato Heinz, MD;  Location: [facility] ORS;  Service: Orthopedics;  Laterality: Left;   TUBAL LIGATION      Home Medications: Prior to Admission medications   Medication Sig Start Date End Date Taking? Authorizing Provider  acetaminophen (TYLENOL) 500 MG tablet Take 500-1,000 mg by mouth 2 (two) times daily. 2 tabs in the am, 1 tab at bedtime   Yes [provider]  DULoxetine (CYMBALTA) 30 MG capsule Take 30 mg by mouth daily.   Yes [provider]  furosemide (LASIX) 40 MG tablet Take 40 mg by mouth daily. 03/24/23  Yes [provider]  levothyroxine (SYNTHROID) 175 MCG tablet Take 175 mcg by mouth daily before breakfast.   Yes [provider]  loratadine (CLARITIN) 10 MG tablet Take 10 mg by mouth daily.   Yes [provider]  potassium chloride SA (KLOR-CON M20) 20 MEQ tablet Take 20 mEq by mouth as directed. 09/26/22  Yes [provider]  SOTALOL AF 80 MG TABS Take 80 mg by mouth daily. 04/21/23  Yes [provider]  losartan (COZAAR) 100 MG tablet Take 100 mg by mouth daily. Patient not taking: Reported on 06/04/2023 10/12/19   [provider]    Allergies: Allergies  Allergen Reactions   Amoxicillin Hives and Rash   Celebrex [Celecoxib]  Anaphylaxis and Shortness Of Breath    Social History:  reports that she has never smoked. She has never used smokeless tobacco. She reports that she does not drink alcohol and does not use drugs.   Family History: Family History  Problem Relation Age of Onset   Breast cancer Sister 52    Review of Systems: Review of Systems  Constitutional:  Negative for chills and fever.  HENT:  Negative for hearing loss.   Respiratory:  Negative for shortness of breath.   Cardiovascular:  Negative for chest pain.   Gastrointestinal:  Positive for abdominal pain. Negative for nausea and vomiting.  Genitourinary:  Negative for dysuria.  Musculoskeletal:  Positive for back pain.  Skin:  Negative for rash.  Neurological:  Negative for dizziness.  Psychiatric/Behavioral:  Negative for depression.     Physical Exam BP (!) 164/73   Pulse (!) 58   Temp 98.7 F (37.1 C) (Oral)   Resp 12   Ht 5\' 4"  (1.626 m)   Wt 111.6 kg   SpO2 97%   BMI 42.23 kg/m  CONSTITUTIONAL: No acute distress HEENT:  Normocephalic, atraumatic, extraocular motion intact. NECK: Trachea is midline, and there is no jugular venous distension. RESPIRATORY:  Normal respiratory effort without pathologic use of accessory muscles. CARDIOVASCULAR: Regular rhythm and rate. GI: The abdomen is soft, non-distended, with tenderness to palpation mostly in epigastric area but overall there some degree of tenderness from umbilicus going superiorly.  MUSCULOSKELETAL:  Normal muscle strength and tone in all four extremities.  No peripheral edema or cyanosis. SKIN: Skin turgor is normal. There are no pathologic skin lesions.  NEUROLOGIC:  Motor and sensation is grossly normal.  Cranial nerves are grossly intact. PSYCH:  Alert and oriented to person, place and time. Affect is normal.  Laboratory Analysis: Results for orders placed or performed during the hospital encounter of 06/04/23 (from the past 24 hour(s))  CBC     Status: None   Collection Time: 06/04/23  4:25 AM  Result Value Ref Range   WBC 9.4 4.0 - 10.5 K/uL   RBC 4.23 3.87 - 5.11 MIL/uL   Hemoglobin 12.8 12.0 - 15.0 g/dL   HCT 40.9 81.1 - 91.4 %   MCV 93.1 80.0 - 100.0 fL   MCH 30.3 26.0 - 34.0 pg   MCHC 32.5 30.0 - 36.0 g/dL   RDW 78.2 95.6 - 21.3 %   Platelets 216 150 - 400 K/uL   nRBC 0.0 0.0 - 0.2 %  Troponin I (High Sensitivity)     Status: None   Collection Time: 06/04/23  4:25 AM  Result Value Ref Range   Troponin I (High Sensitivity) 8 <18 ng/L  Protime-INR (order  if Patient is taking Coumadin / Warfarin)     Status: None   Collection Time: 06/04/23  4:25 AM  Result Value Ref Range   Prothrombin Time 13.7 11.4 - 15.2 seconds   INR 1.0 0.8 - 1.2  Comprehensive metabolic panel     Status: Abnormal   Collection Time: 06/04/23  4:25 AM  Result Value Ref Range   Sodium 141 135 - 145 mmol/L   Potassium 3.9 3.5 - 5.1 mmol/L   Chloride 108 98 - 111 mmol/L   CO2 26 22 - 32 mmol/L   Glucose, Bld 243 (H) 70 - 99 mg/dL   BUN 18 8 - 23 mg/dL   Creatinine, Ser 0.86 0.44 - 1.00 mg/dL   Calcium 9.1 8.9 - 57.8 mg/dL   Total  Protein 7.4 6.5 - 8.1 g/dL   Albumin 4.1 3.5 - 5.0 g/dL   AST 81 (H) 15 - 41 U/L   ALT 41 0 - 44 U/L   Alkaline Phosphatase 139 (H) 38 - 126 U/L   Total Bilirubin 1.3 (H) 0.3 - 1.2 mg/dL   GFR, Estimated >16 >10 mL/min   Anion gap 7 5 - 15  Lipase, blood     Status: Abnormal   Collection Time: 06/04/23  4:25 AM  Result Value Ref Range   Lipase 3,531 (H) 11 - 51 U/L  Troponin I (High Sensitivity)     Status: None   Collection Time: 06/04/23  6:48 AM  Result Value Ref Range   Troponin I (High Sensitivity) 8 <18 ng/L  CBC with Differential/Platelet     Status: Abnormal   Collection Time: 06/04/23 10:31 AM  Result Value Ref Range   WBC 10.0 4.0 - 10.5 K/uL   RBC 3.88 3.87 - 5.11 MIL/uL   Hemoglobin 11.7 (L) 12.0 - 15.0 g/dL   HCT 96.0 45.4 - 09.8 %   MCV 94.3 80.0 - 100.0 fL   MCH 30.2 26.0 - 34.0 pg   MCHC 32.0 30.0 - 36.0 g/dL   RDW 11.9 14.7 - 82.9 %   Platelets 192 150 - 400 K/uL   nRBC 0.0 0.0 - 0.2 %   Neutrophils Relative % 61 %   Neutro Abs 6.2 1.7 - 7.7 K/uL   Lymphocytes Relative 30 %   Lymphs Abs 3.0 0.7 - 4.0 K/uL   Monocytes Relative 8 %   Monocytes Absolute 0.8 0.1 - 1.0 K/uL   Eosinophils Relative 1 %   Eosinophils Absolute 0.1 0.0 - 0.5 K/uL   Basophils Relative 0 %   Basophils Absolute 0.0 0.0 - 0.1 K/uL   Immature Granulocytes 0 %   Abs Immature Granulocytes 0.02 0.00 - 0.07 K/uL  Comprehensive  metabolic panel     Status: Abnormal   Collection Time: 06/04/23 10:31 AM  Result Value Ref Range   Sodium 141 135 - 145 mmol/L   Potassium 3.6 3.5 - 5.1 mmol/L   Chloride 109 98 - 111 mmol/L   CO2 27 22 - 32 mmol/L   Glucose, Bld 142 (H) 70 - 99 mg/dL   BUN 15 8 - 23 mg/dL   Creatinine, Ser 5.62 0.44 - 1.00 mg/dL   Calcium 8.6 (L) 8.9 - 10.3 mg/dL   Total Protein 6.4 (L) 6.5 - 8.1 g/dL   Albumin 3.5 3.5 - 5.0 g/dL   AST 58 (H) 15 - 41 U/L   ALT 36 0 - 44 U/L   Alkaline Phosphatase 124 38 - 126 U/L   Total Bilirubin 0.7 0.3 - 1.2 mg/dL   GFR, Estimated >13 >08 mL/min   Anion gap 5 5 - 15  TSH     Status: None   Collection Time: 06/04/23 10:31 AM  Result Value Ref Range   TSH 1.326 0.350 - 4.500 uIU/mL  Lactic acid, plasma     Status: None   Collection Time: 06/04/23 10:31 AM  Result Value Ref Range   Lactic Acid, Venous 1.0 0.5 - 1.9 mmol/L  CBG monitoring, ED     Status: Abnormal   Collection Time: 06/04/23 10:35 AM  Result Value Ref Range   Glucose-Capillary 140 (H) 70 - 99 mg/dL  CBG monitoring, ED     Status: Abnormal   Collection Time: 06/04/23 12:43 PM  Result Value Ref Range   Glucose-Capillary  119 (H) 70 - 99 mg/dL    Imaging: MR ABDOMEN MRCP WO CONTRAST  Result Date: 06/04/2023 CLINICAL DATA:  Pancreatitis. EXAM: MRI ABDOMEN WITHOUT CONTRAST  (INCLUDING MRCP) TECHNIQUE: Multiplanar multisequence MR imaging of the abdomen was performed. Heavily T2-weighted images of the biliary and pancreatic ducts were obtained, and three-dimensional MRCP images were rendered by post processing. COMPARISON:  CT scan 06/04/2023 FINDINGS: Lower chest: Unremarkable. Hepatobiliary: No suspicious focal abnormality in the liver on this study without intravenous contrast. Layering tiny stones evident. Gallbladder wall thickness is upper normal to borderline enlarged. No intrahepatic or extrahepatic biliary dilation. Common duct diameter is 6 mm. Common bile duct diameter in the head of the  pancreas is 4 mm. No evidence for choledocholithiasis. Pancreas: Diffuse pancreatic edema noted with head and body most affected. There is. Pancreatic edema tracking in the anterior pararenal space. Probable extension of edema into the transverse mesocolon. No focal fluid collection evident. Spleen:  No splenomegaly. No suspicious focal mass lesion. Adrenals/Urinary Tract: No adrenal nodule or mass. Kidneys unremarkable. Stomach/Bowel: Stomach is unremarkable. No gastric wall thickening. No evidence of outlet obstruction. Duodenum is normally positioned as is the ligament of Treitz. No small bowel or colonic dilatation within the visualized abdomen. Vascular/Lymphatic: No abdominal aortic aneurysm. No abdominal lymphadenopathy Other: No substantial intraperitoneal free fluid. There may be some trace fluid along the inferior margin of the liver. Musculoskeletal: No suspicious marrow signal abnormality. IMPRESSION: 1. Diffuse pancreatic edema with head and body most affected. There is edema tracking in the anterior pararenal space and probable extension of edema into the transverse mesocolon. No focal fluid collection evident. Imaging features are compatible with acute pancreatitis. 2. Cholelithiasis with upper normal to borderline gallbladder wall thickness. No intrahepatic or extrahepatic biliary dilation. No evidence for choledocholithiasis. Electronically Signed   By: Kennith Center M.D.   On: 06/04/2023 10:59   MR 3D Recon At Scanner  Result Date: 06/04/2023 CLINICAL DATA:  Pancreatitis. EXAM: MRI ABDOMEN WITHOUT CONTRAST  (INCLUDING MRCP) TECHNIQUE: Multiplanar multisequence MR imaging of the abdomen was performed. Heavily T2-weighted images of the biliary and pancreatic ducts were obtained, and three-dimensional MRCP images were rendered by post processing. COMPARISON:  CT scan 06/04/2023 FINDINGS: Lower chest: Unremarkable. Hepatobiliary: No suspicious focal abnormality in the liver on this study without  intravenous contrast. Layering tiny stones evident. Gallbladder wall thickness is upper normal to borderline enlarged. No intrahepatic or extrahepatic biliary dilation. Common duct diameter is 6 mm. Common bile duct diameter in the head of the pancreas is 4 mm. No evidence for choledocholithiasis. Pancreas: Diffuse pancreatic edema noted with head and body most affected. There is. Pancreatic edema tracking in the anterior pararenal space. Probable extension of edema into the transverse mesocolon. No focal fluid collection evident. Spleen:  No splenomegaly. No suspicious focal mass lesion. Adrenals/Urinary Tract: No adrenal nodule or mass. Kidneys unremarkable. Stomach/Bowel: Stomach is unremarkable. No gastric wall thickening. No evidence of outlet obstruction. Duodenum is normally positioned as is the ligament of Treitz. No small bowel or colonic dilatation within the visualized abdomen. Vascular/Lymphatic: No abdominal aortic aneurysm. No abdominal lymphadenopathy Other: No substantial intraperitoneal free fluid. There may be some trace fluid along the inferior margin of the liver. Musculoskeletal: No suspicious marrow signal abnormality. IMPRESSION: 1. Diffuse pancreatic edema with head and body most affected. There is edema tracking in the anterior pararenal space and probable extension of edema into the transverse mesocolon. No focal fluid collection evident. Imaging features are compatible with acute pancreatitis. 2. Cholelithiasis  with upper normal to borderline gallbladder wall thickness. No intrahepatic or extrahepatic biliary dilation. No evidence for choledocholithiasis. Electronically Signed   By: Kennith Center M.D.   On: 06/04/2023 10:59   US ABDOMEN LIMITED RUQ (LIVER/GB)  Result Date: 06/04/2023 CLINICAL DATA:  Pancreatitis with upper abdominal pain EXAM: ULTRASOUND ABDOMEN LIMITED RIGHT UPPER QUADRANT COMPARISON:  Abdominal CT from earlier today FINDINGS: Gallbladder: Layering calcification for  multiple small calculi. Cholesterolosis with ring down artifact at the anterior wall of the gallbladder. No focal tenderness or pericholecystic edema. Common bile duct: Diameter: 8 mm, borderline dilated for age. Where visualized, no filling defect. Liver: No focal lesion identified. Within normal limits in parenchymal echogenicity. Portal vein is patent on color Doppler imaging with normal direction of blood flow towards the liver. IMPRESSION: 1. Cholelithiasis without evidence of cholecystitis. 2. Borderline CBD dilatation without visible choledocholithiasis. Electronically Signed   By: Tiburcio Pea M.D.   On: 06/04/2023 07:32   CT Angio Chest PE W/Cm &/Or Wo Cm  Result Date: 06/04/2023 CLINICAL DATA:  Epigastric chest pain that radiates to the left breast into her back between the shoulder blades. EXAM: CT ANGIOGRAPHY CHEST WITH CONTRAST TECHNIQUE: Multidetector CT imaging of the chest was performed using the standard protocol during bolus administration of intravenous contrast. Multiplanar CT image reconstructions and MIPs were obtained to evaluate the vascular anatomy. RADIATION DOSE REDUCTION: This exam was performed according to the departmental dose-optimization program which includes automated exposure control, adjustment of the mA and/or kV according to patient size and/or use of iterative reconstruction technique. CONTRAST:  OMNIPAQUE IOHEXOL 350 MG/ML SOLN COMPARISON:  11/03/2020 FINDINGS: Cardiovascular: The heart size is normal. No substantial pericardial effusion. Mild atherosclerotic calcification is noted in the wall of the thoracic aorta. There is no filling defect within the opacified pulmonary arteries to suggest the presence of an acute pulmonary embolus. Mediastinum/Nodes: No mediastinal lymphadenopathy. There is no hilar lymphadenopathy. The esophagus has normal imaging features. There is no axillary lymphadenopathy. Lungs/Pleura: No suspicious pulmonary nodule or mass. No focal  airspace consolidation. No pleural effusion. Upper Abdomen: Subtle irregularity of liver contour raises the question of cirrhosis. Layering tiny calcified gallstones evident. Subtle edema/stranding is noted anterior to the pancreas and extending into the transverse mesocolon. No main duct dilatation the pancreas. Pancreatic parenchyma incompletely visualized. Musculoskeletal: No worrisome lytic or sclerotic osseous abnormality. Review of the MIP images confirms the above findings. IMPRESSION: 1. No CT evidence for acute pulmonary embolus. 2. Subtle edema/stranding anterior to the pancreas and extending into the transverse mesocolon. Imaging features are concerning for acute pancreatitis. Correlation with amylase/lipase may prove helpful. 3. Subtle irregularity of liver contour raises the question of cirrhosis. 4. Cholelithiasis. 5.  Aortic Atherosclerosis (ICD10-I70.0). Electronically Signed   By: Kennith Center M.D.   On: 06/04/2023 06:02   CT ABDOMEN PELVIS W CONTRAST  Result Date: 06/04/2023 CLINICAL DATA:  Acute, nonlocalized abdominal pain EXAM: CT ABDOMEN AND PELVIS WITH CONTRAST TECHNIQUE: Multidetector CT imaging of the abdomen and pelvis was performed using the standard protocol following bolus administration of intravenous contrast. RADIATION DOSE REDUCTION: This exam was performed according to the departmental dose-optimization program which includes automated exposure control, adjustment of the mA and/or kV according to patient size and/or use of iterative reconstruction technique. CONTRAST:  OMNIPAQUE IOHEXOL 350 MG/ML SOLN COMPARISON:  Renal stone CT 07/23/2022 FINDINGS: Lower chest:  No contributory findings. Hepatobiliary: No focal liver abnormality.Layering gallstones. No evidence of acute cholecystitis. No biliary dilatation Pancreas: Fat stranding at  the root of the small bowel mesentery. The pancreas has an indistinct margination and slight diffuse fullness. No necrosis or collection.  Spleen: Unremarkable. Adrenals/Urinary Tract: Negative adrenals. No hydronephrosis or stone. Unremarkable bladder. Stomach/Bowel:  No obstruction. No appendicitis. Vascular/Lymphatic: No acute vascular abnormality. Fat stranding at the root of the small bowel mesentery. No mass or adenopathy. Reproductive:No pathologic findings. Other: No ascites or pneumoperitoneum. Musculoskeletal: No acute abnormalities. IMPRESSION: 1. Acute edematous pancreatitis versus mesenteric panniculitis, correlate with serum enzymes. 2. Cholelithiasis. Electronically Signed   By: Tiburcio Pea M.D.   On: 06/04/2023 06:00   DG Chest Port 1 View  Result Date: 06/04/2023 CLINICAL DATA:  Chest pain EXAM: PORTABLE CHEST 1 VIEW COMPARISON:  07/23/2022 FINDINGS: Normal heart size and mediastinal contours for portable technique. Density at the right apex is from costochondral junction and stable from prior. There is no edema, consolidation, effusion, or pneumothorax. IMPRESSION: No evidence of active disease. Electronically Signed   By: Tiburcio Pea M.D.   On: 06/04/2023 04:58    Assessment and Plan: This is a 72 y.o. female with gallstone pancreatitis.  --Discussed with the patient the findings on her imaging studies.  She has gallstone pancreatitis without cholecystitis.  There is pancreatic edema with some extension in retroperitoneal space around the right kidney and transverse mesocolon.  Discussed with the patient the mostly likely source of her pancreatitis is related to her gallstones.  On MRCP there was no choledocholithiasis so most likely she passed a stone.  Her repeat labs later in the morning showed improving LFTs.   --Discussed with the patient that the typical management is conservative for the pancreatitis itself, but cholecystectomy would be the main management for her cholelithiasis as the source of her pancreatitis.  Timing for this will depend on her clinical improvement.  There is potential to do her  cholecystectomy during this admission, but if any worsening symptoms or features with her pancreatitis may delay her surgery to outpatient setting. --Recommend for now keeping her NPO with IV fluid hydration.  Will repeat labs in AM.  All of her questions have been answered.  I spent 60 minutes dedicated to the care of this patient on the date of this encounter to include pre-visit review of records, face-to-face time with the patient discussing diagnosis and management, and any post-visit coordination of care.   Howie Ill, MD [facility]:  [phone]

## 2023-06-04 NOTE — ED Notes (Addendum)
0700 Report received by Kennith Center RN at this time.    1120 Report given to Molli Hazard RN at this time.

## 2023-06-04 NOTE — Progress Notes (Signed)
Pt is complaining of headache, 5/10 pain scale. Pt is aware that she is on NPO. Informed pt that she can have Tylenol suppository for the headache. Pt refused the suppository for now. Latest CBG is 111 mg/dL.

## 2023-06-04 NOTE — ED Triage Notes (Signed)
Reports epigastric chest pain with radiation to L breast and to her back between her shoulder blades. States has been ongoing for 2 days. Described as a squeezing with intermittent sharp pains. Pt alert and oriented following commands. Breathing unlabored speaking in full sentences. Symmetric chest rise and fall. Ambulatory to triage.

## 2023-06-05 DIAGNOSIS — E119 Type 2 diabetes mellitus without complications: Secondary | ICD-10-CM

## 2023-06-05 DIAGNOSIS — K219 Gastro-esophageal reflux disease without esophagitis: Secondary | ICD-10-CM | POA: Diagnosis present

## 2023-06-05 DIAGNOSIS — K851 Biliary acute pancreatitis without necrosis or infection: Secondary | ICD-10-CM | POA: Diagnosis not present

## 2023-06-05 DIAGNOSIS — E039 Hypothyroidism, unspecified: Secondary | ICD-10-CM

## 2023-06-05 DIAGNOSIS — I10 Essential (primary) hypertension: Secondary | ICD-10-CM | POA: Diagnosis present

## 2023-06-05 LAB — COMPREHENSIVE METABOLIC PANEL
ALT: 28 U/L (ref 0–44)
AST: 36 U/L (ref 15–41)
Albumin: 3.5 g/dL (ref 3.5–5.0)
Alkaline Phosphatase: 102 U/L (ref 38–126)
Anion gap: 6 (ref 5–15)
BUN: 11 mg/dL (ref 8–23)
CO2: 26 mmol/L (ref 22–32)
Calcium: 8.8 mg/dL — ABNORMAL LOW (ref 8.9–10.3)
Chloride: 110 mmol/L (ref 98–111)
Creatinine, Ser: 0.62 mg/dL (ref 0.44–1.00)
GFR, Estimated: >60 mL/min (ref >60)
Glucose, Bld: 129 mg/dL — ABNORMAL HIGH (ref 70–99)
Potassium: 3.4 mmol/L — ABNORMAL LOW (ref 3.5–5.1)
Sodium: 142 mmol/L (ref 135–145)
Total Bilirubin: 0.8 mg/dL (ref 0.3–1.2)
Total Protein: 6.2 g/dL — ABNORMAL LOW (ref 6.5–8.1)

## 2023-06-05 LAB — GLUCOSE, CAPILLARY
Glucose-Capillary: 132 mg/dL — ABNORMAL HIGH (ref 70–99)
Glucose-Capillary: 136 mg/dL — ABNORMAL HIGH (ref 70–99)
Glucose-Capillary: 158 mg/dL — ABNORMAL HIGH (ref 70–99)
Glucose-Capillary: 142 mg/dL — ABNORMAL HIGH (ref 70–99)
Glucose-Capillary: 120 mg/dL — ABNORMAL HIGH (ref 70–99)

## 2023-06-05 LAB — CBC
HCT: 35.6 % — ABNORMAL LOW (ref 36.0–46.0)
Hemoglobin: 11.3 g/dL — ABNORMAL LOW (ref 12.0–15.0)
MCH: 30.1 pg (ref 26.0–34.0)
MCHC: 31.7 g/dL (ref 30.0–36.0)
MCV: 94.9 fL (ref 80.0–100.0)
Platelets: 173 10*3/uL (ref 150–400)
RBC: 3.75 MIL/uL — ABNORMAL LOW (ref 3.87–5.11)
RDW: 13.6 % (ref 11.5–15.5)
WBC: 12.4 10*3/uL — ABNORMAL HIGH (ref 4.0–10.5)
nRBC: 0 % (ref 0.0–0.2)

## 2023-06-05 LAB — LIPASE, BLOOD: Lipase: 314 U/L — ABNORMAL HIGH (ref 11–51)

## 2023-06-05 MED ORDER — POTASSIUM CHLORIDE CRYS ER 20 MEQ PO TBCR
40.0000 meq | EXTENDED_RELEASE_TABLET | Freq: Once | ORAL | Status: AC
  Administered 2023-06-05: 40 meq via ORAL
  Filled 2023-06-05: qty 2

## 2023-06-05 MED ORDER — LEVOTHYROXINE SODIUM 50 MCG PO TABS
175.0000 ug | ORAL_TABLET | Freq: Every day | ORAL | Status: DC
  Administered 2023-06-06 – 2023-06-08 (×2): 175 ug via ORAL
  Filled 2023-06-05 (×3): qty 1

## 2023-06-05 MED ORDER — DULOXETINE HCL 30 MG PO CPEP
30.0000 mg | ORAL_CAPSULE | Freq: Every day | ORAL | Status: DC
  Administered 2023-06-05 – 2023-06-08 (×4): 30 mg via ORAL
  Filled 2023-06-05 (×4): qty 1

## 2023-06-05 MED ORDER — LORATADINE 10 MG PO TABS
10.0000 mg | ORAL_TABLET | Freq: Every day | ORAL | Status: DC
  Administered 2023-06-05 – 2023-06-08 (×4): 10 mg via ORAL
  Filled 2023-06-05 (×4): qty 1

## 2023-06-05 MED ORDER — SODIUM CHLORIDE 0.9 % IV SOLN
12.5000 mg | Freq: Four times a day (QID) | INTRAVENOUS | Status: DC | PRN
  Administered 2023-06-07: 12.5 mg via INTRAVENOUS
  Filled 2023-06-05: qty 12.5

## 2023-06-05 MED ORDER — SOTALOL HCL 80 MG PO TABS
80.0000 mg | ORAL_TABLET | Freq: Every day | ORAL | Status: DC
  Administered 2023-06-05 – 2023-06-08 (×4): 80 mg via ORAL
  Filled 2023-06-05 (×4): qty 1

## 2023-06-05 MED ORDER — HYDRALAZINE HCL 25 MG PO TABS
25.0000 mg | ORAL_TABLET | Freq: Three times a day (TID) | ORAL | Status: DC | PRN
  Administered 2023-06-07 – 2023-06-08 (×2): 25 mg via ORAL
  Filled 2023-06-05 (×2): qty 1

## 2023-06-05 NOTE — Assessment & Plan Note (Signed)
Diet control at home, CBG mostly in mid 100s.  A1c of 7.1 -Very sensitive SSI

## 2023-06-05 NOTE — Progress Notes (Signed)
Progress Note   Patient: Amanda Holmes:[id number] DOB: Apr 10, 1951 DOA: 06/04/2023     1 DOS: the patient was seen and examined on 06/05/2023   Brief hospital course: Taken from H&P.  Amanda Holmes is a 72 y.o. female with medical history significant of Atrial fibrillation not on OAC, type II diabetes, obesity, essential hypertension,GERD, hypothyroidism, CHFpef who presents to ED with Substernal chest pain as well as epigastric pain with radiation to her back and shoulder blade.  Patient was admitted for acute pancreatitis with lipase at 3531. RUQ ultrasound with cholelithiasis, no evidence of cholecystitis.  Borderline CBD dilatation without visible choledocholithiasis.  CT abdomen and pelvis with following impression 1. Acute edematous pancreatitis versus mesenteric panniculitis, correlate with serum enzymes. 2. Cholelithiasis.  EDP consulted GI, per Dr. Servando Snare no need for ERCP at this time. General surgery was also consulted, they are recommending cholecystectomy as outpatient once recovered from current illness.  6/24: Blood pressure elevated at 160/70, CMP with improved transaminitis, mild hypokalemia at 3.4, significant improvement in lipase to 314, mild leukocytosis at 12.4.  Patient continued to have significant abdominal pain, started on clear liquid diet.    Assessment and Plan: * Acute pancreatitis Initial concern of choledocholithiasis but MRCP was negative.  Per GI no need for ERCP at this time.  Might have passed stone. Improving lipase, continue to have some abdominal pain. General surgery will consider cholecystectomy as outpatient after recovering from current illness. -Continue with supportive care and pain management -Started on clear liquid diet-will advance as tolerated  Atrial fibrillation with RVR (HCC) Not on anticoagulation at home. -Continue with sotalol  Essential hypertension Blood pressure mildly elevated.  Patient was on Lasix and sotalol at  home. Apparently not taking losartan. -Continue sotalol -Holding Lasix due to poor p.o. intake -As needed hydralazine   Type 2 diabetes mellitus (HCC) Diet control at home, CBG mostly in mid 100s.  A1c of 7.1 -Very sensitive SSI  GERD (gastroesophageal reflux disease) -Continue PPI  Obesity, Class III, BMI 40-49.9 (morbid obesity) (HCC) Estimated body mass index is 43.41 kg/m as calculated from the following:   Height as of this encounter: 5\' 4"  (1.626 m).   Weight as of this encounter: 114.7 kg.   -This will complicate overall prognosis  Hypothyroidism -Continue home Synthroid   Subjective: Patient continued to have right upper quadrant and epigastric pain, stating it is better than before and rating at 4/10.  Appetite and p.o. intake remained poor.  Having some headache and asking for Tylenol.  Physical Exam: Vitals:   06/04/23 2342 06/05/23 0420 06/05/23 0807 06/05/23 1111  BP: (!) 153/70 (!) 151/65 (!) 160/70 (!) 149/65  Pulse: 76 83 73 71  Resp: 20 20 20 20   Temp: 98.5 F (36.9 C) 98.5 F (36.9 C) 98.8 F (37.1 C) 98.7 F (37.1 C)  TempSrc:   Oral Oral  SpO2: 91% (!) 89% 94% 94%  Weight:   114.7 kg   Height:       General.  Obese lady, in no acute distress. Pulmonary.  Lungs clear bilaterally, normal respiratory effort. CV.  Regular rate and rhythm, no JVD, rub or murmur. Abdomen.  Soft, mild RUQ tenderness, nondistended, BS positive. CNS.  Alert and oriented .  No focal neurologic deficit. Extremities.  No edema, no cyanosis, pulses intact and symmetrical. Psychiatry.  Judgment and insight appears normal.   Data Reviewed: Prior data reviewed.  Family Communication: Sister and brother-in-law at bedside  Disposition: Status is: Inpatient Remains  inpatient appropriate because: Severity of illness.  If continue to improve then discharged tomorrow  Planned Discharge Destination: Home  DVT prophylaxis.  Subcu heparin Time spent: 45 minutes  This record  has been created using Conservation officer, historic buildings. Errors have been sought and corrected,but may not always be located. Such creation errors do not reflect on the standard of care.   Author: Arnetha Courser, MD 06/05/2023 1:33 PM  For on call review [email or web address].

## 2023-06-05 NOTE — Progress Notes (Signed)
[facility] SURGICAL PROGRESS NOTE (cpt [phone])  Hospital Day(s): 1.   Interval History: Patient seen and examined, no acute events or new complaints overnight. Patient reports feeling overall improved slightly but does still endorse upper and RUQ abdominal pain. This seems worse with movement and deep breathing. No fever, chills, nausea, emesis. Does have a leukocytosis this AM to 12.4K. Hgb to 11.3. Renal function normal; sCr - 0.62; UO - unmeasured. Mild hypokalemia to 3.4. Lipase making improvements, now 314 (from 3500). She is NPO.   Review of Systems:  Constitutional: denies fever, chills  HEENT: denies cough or congestion  Respiratory: denies any shortness of breath  Cardiovascular: denies chest pain or palpitations  Gastrointestinal: + abdominal pain; denied N/V Genitourinary: denies burning with urination or urinary frequency Musculoskeletal: denies pain, decreased motor or sensation  Vital signs in last 24 hours: [min-max] current  Temp:  [97.7 F (36.5 C)-98.7 F (37.1 C)] 98.5 F (36.9 C) (06/24 0420) Pulse Rate:  [58-83] 83 (06/24 0420) Resp:  [11-20] 20 (06/24 0420) BP: (145-174)/(65-87) 151/65 (06/24 0420) SpO2:  [89 %-97 %] 89 % (06/24 0420)     Height: 5\' 4"  (162.6 cm) Weight: 111.6 kg BMI (Calculated): 42.21   Intake/Output last 2 shifts:  06/23 0701 - 06/24 0700 In: 1820 [I.V.:1320; IV Piggyback:500] Out: -    Physical Exam:  Constitutional: alert, cooperative and no distress  HENT: normocephalic without obvious abnormality  Eyes: PERRL, EOM's grossly intact and symmetric  Respiratory: breathing non-labored at rest  Cardiovascular: regular rate and sinus rhythm  Gastrointestinal: soft, still with upper abdominal pain on examination, fairly significant, and non-distended. No rebound/guarding. She is without peritonitis  Musculoskeletal: no edema or wounds, motor and sensation grossly intact, NT    Labs:     Latest Ref Rng & Units 06/05/2023     3:40 AM 06/04/2023   10:31 AM 06/04/2023    4:25 AM  CBC  WBC 4.0 - 10.5 K/uL 12.4  10.0  9.4   Hemoglobin 12.0 - 15.0 g/dL 64.4  03.4  74.2   Hematocrit 36.0 - 46.0 % 35.6  36.6  39.4   Platelets 150 - 400 K/uL 173  192  216       Latest Ref Rng & Units 06/05/2023    3:40 AM 06/04/2023   10:31 AM 06/04/2023    4:25 AM  CMP  Glucose 70 - 99 mg/dL 595  638  756   BUN 8 - 23 mg/dL 11  15  18    Creatinine 0.44 - 1.00 mg/dL 4.33  2.95  1.88   Sodium 135 - 145 mmol/L 142  141  141   Potassium 3.5 - 5.1 mmol/L 3.4  3.6  3.9   Chloride 98 - 111 mmol/L 110  109  108   CO2 22 - 32 mmol/L 26  27  26    Calcium 8.9 - 10.3 mg/dL 8.8  8.6  9.1   Total Protein 6.5 - 8.1 g/dL 6.2  6.4  7.4   Total Bilirubin 0.3 - 1.2 mg/dL 0.8  0.7  1.3   Alkaline Phos 38 - 126 U/L 102  124  139   AST 15 - 41 U/L 36  58  81   ALT 0 - 44 U/L 28  36  41      Imaging studies: No new pertinent imaging studies   Assessment/Plan: 72 y.o. female still with significant abdominal pain admitted with gallstone pancreatitis    - Unfortunately still with  significant abdominal pain otherwise making appropriate improvements., Suspect this is secondary to residual inflammation secondary to her intra-abdominal process. As such, will hold off on any surgical intervention for now, and likely this admission. Will likely pursue cholecystectomy in interval fashion as outpatient   - Reasonable to trial CLD. If pain worsens with diet, needs to return to NPO - Continue IVF support - Monitor abdominal examination - Pain control prn; antiemetics prn - Monitor K+; replete as needed - Monitor lipase; improving   - Mobilize as tolerated  - Further management per primary service; we will follow   All of the above findings and recommendations were discussed with the patient, and the medical team, and all of patient's questions were answered to her expressed satisfaction.  -- Lynden Oxford, PA-C [facility] 06/05/2023, 7:55 AM M-F: 7am - 4pm

## 2023-06-05 NOTE — Hospital Course (Signed)
Taken from H&P.  Amanda Holmes is a 72 y.o. female with medical history significant of Atrial fibrillation not on OAC, type II diabetes, obesity, essential hypertension,GERD, hypothyroidism, CHFpef who presents to ED with Substernal chest pain as well as epigastric pain with radiation to her back and shoulder blade.  Patient was admitted for acute pancreatitis with lipase at 3531. RUQ ultrasound with cholelithiasis, no evidence of cholecystitis.  Borderline CBD dilatation without visible choledocholithiasis.  CT abdomen and pelvis with following impression 1. Acute edematous pancreatitis versus mesenteric panniculitis, correlate with serum enzymes. 2. Cholelithiasis.  EDP consulted GI, per Dr. Servando Snare no need for ERCP at this time. General surgery was also consulted, they are recommending cholecystectomy as outpatient once recovered from current illness.  6/24: Blood pressure elevated at 160/70, CMP with improved transaminitis, mild hypokalemia at 3.4, significant improvement in lipase to 314, mild leukocytosis at 12.4.  Patient continued to have significant abdominal pain, started on clear liquid diet.  6/25; worsening abdominal pain and leukocytosis.  Repeat CT abdomen with mild worsening of acute pancreatitis.  No evidence of pancreatic necrosis, pseudo cyst or other complications.  Developed tiny bilateral pleural effusion and trace pelvic ascites. Starting on ceftriaxone and Flagyl.  6/26: Leukocytosis improving.  Worsening abdominal pain and some nausea, mostly lower abdomen, did not had any bowel movement with MiraLAX.  Ordered enema. Willing to try clear liquid diet.  Lipase has been normalized.  6/27: Patient feeling much improved.  Able to tolerate soft diet.  Had a good bowel movement with enema which helped with her abdominal pain.  She wants to go home and is being discharged on 3 more days of Cipro and Flagyl to complete the antibiotic course.  She was also started on  spironolactone to help with her swelling and blood pressure.  She was advised to slowly advance her diet, and avoid constipation.  Patient will continue on current medications and need to have a close follow-up with her providers which include general surgery for elective cholecystectomy.

## 2023-06-05 NOTE — Assessment & Plan Note (Signed)
Initial concern of choledocholithiasis but MRCP was negative.  Per GI no need for ERCP at this time.  Might have passed stone. Improving lipase, continue to have some abdominal pain. General surgery will consider cholecystectomy as outpatient after recovering from current illness. -Continue with supportive care and pain management -Started on clear liquid diet-will advance as tolerated

## 2023-06-05 NOTE — Assessment & Plan Note (Signed)
-   Continue home Synthroid °

## 2023-06-05 NOTE — Assessment & Plan Note (Signed)
Continue PPI ?

## 2023-06-05 NOTE — Assessment & Plan Note (Signed)
Estimated body mass index is 43.41 kg/m as calculated from the following:   Height as of this encounter: 5\' 4"  (1.626 m).   Weight as of this encounter: 114.7 kg.   -This will complicate overall prognosis

## 2023-06-05 NOTE — Assessment & Plan Note (Signed)
Not on anticoagulation at home. -Continue with sotalol

## 2023-06-05 NOTE — Assessment & Plan Note (Addendum)
Blood pressure mildly elevated.  Patient was on Lasix and sotalol at home. Apparently not taking losartan. -Continue sotalol -Holding Lasix due to poor p.o. intake -As needed hydralazine

## 2023-06-06 ENCOUNTER — Inpatient Hospital Stay: Payer: Medicare HMO

## 2023-06-06 DIAGNOSIS — K851 Biliary acute pancreatitis without necrosis or infection: Secondary | ICD-10-CM | POA: Diagnosis not present

## 2023-06-06 LAB — GLUCOSE, CAPILLARY
Glucose-Capillary: 112 mg/dL — ABNORMAL HIGH (ref 70–99)
Glucose-Capillary: 116 mg/dL — ABNORMAL HIGH (ref 70–99)
Glucose-Capillary: 128 mg/dL — ABNORMAL HIGH (ref 70–99)
Glucose-Capillary: 65 mg/dL — ABNORMAL LOW (ref 70–99)
Glucose-Capillary: 78 mg/dL (ref 70–99)
Glucose-Capillary: 87 mg/dL (ref 70–99)
Glucose-Capillary: 90 mg/dL (ref 70–99)
Glucose-Capillary: 95 mg/dL (ref 70–99)

## 2023-06-06 LAB — BASIC METABOLIC PANEL
Anion gap: 6 (ref 5–15)
BUN: 10 mg/dL (ref 8–23)
CO2: 27 mmol/L (ref 22–32)
Calcium: 8.9 mg/dL (ref 8.9–10.3)
Chloride: 107 mmol/L (ref 98–111)
Creatinine, Ser: 0.64 mg/dL (ref 0.44–1.00)
GFR, Estimated: >60 mL/min (ref >60)
Glucose, Bld: 107 mg/dL — ABNORMAL HIGH (ref 70–99)
Potassium: 3.6 mmol/L (ref 3.5–5.1)
Sodium: 140 mmol/L (ref 135–145)

## 2023-06-06 LAB — CBC
HCT: 35.4 % — ABNORMAL LOW (ref 36.0–46.0)
Hemoglobin: 11.1 g/dL — ABNORMAL LOW (ref 12.0–15.0)
MCH: 30.1 pg (ref 26.0–34.0)
MCHC: 31.4 g/dL (ref 30.0–36.0)
MCV: 95.9 fL (ref 80.0–100.0)
Platelets: 175 10*3/uL (ref 150–400)
RBC: 3.69 MIL/uL — ABNORMAL LOW (ref 3.87–5.11)
RDW: 13.6 % (ref 11.5–15.5)
WBC: 15.1 10*3/uL — ABNORMAL HIGH (ref 4.0–10.5)
nRBC: 0 % (ref 0.0–0.2)

## 2023-06-06 LAB — MAGNESIUM: Magnesium: 2.2 mg/dL (ref 1.7–2.4)

## 2023-06-06 MED ORDER — IOHEXOL 300 MG/ML  SOLN
100.0000 mL | Freq: Once | INTRAMUSCULAR | Status: AC | PRN
  Administered 2023-06-06: 100 mL via INTRAVENOUS

## 2023-06-06 MED ORDER — LACTATED RINGERS IV SOLN: INTRAVENOUS | Status: AC

## 2023-06-06 MED ORDER — FUROSEMIDE 40 MG PO TABS
40.0000 mg | ORAL_TABLET | Freq: Every day | ORAL | Status: DC
  Administered 2023-06-06 – 2023-06-08 (×3): 40 mg via ORAL
  Filled 2023-06-06 (×3): qty 1

## 2023-06-06 MED ORDER — DEXTROSE 50 % IV SOLN
12.5000 g | INTRAVENOUS | Status: AC
  Administered 2023-06-06: 12.5 g via INTRAVENOUS
  Filled 2023-06-06: qty 50

## 2023-06-06 MED ORDER — SODIUM CHLORIDE 0.9 % IV SOLN
2.0000 g | INTRAVENOUS | Status: DC
  Administered 2023-06-06 – 2023-06-08 (×3): 2 g via INTRAVENOUS
  Filled 2023-06-06 (×3): qty 20

## 2023-06-06 MED ORDER — POLYETHYLENE GLYCOL 3350 17 G PO PACK
17.0000 g | PACK | Freq: Every day | ORAL | Status: DC
  Administered 2023-06-06 – 2023-06-07 (×2): 17 g via ORAL
  Filled 2023-06-06 (×4): qty 1

## 2023-06-06 MED ORDER — METRONIDAZOLE 500 MG PO TABS
500.0000 mg | ORAL_TABLET | Freq: Two times a day (BID) | ORAL | Status: DC
  Administered 2023-06-06 – 2023-06-08 (×5): 500 mg via ORAL
  Filled 2023-06-06 (×5): qty 1

## 2023-06-06 MED ORDER — ENOXAPARIN SODIUM 60 MG/0.6ML IJ SOSY
55.0000 mg | PREFILLED_SYRINGE | INTRAMUSCULAR | Status: DC
  Administered 2023-06-06 – 2023-06-07 (×2): 55 mg via SUBCUTANEOUS
  Filled 2023-06-06 (×2): qty 0.6

## 2023-06-06 MED ORDER — POTASSIUM CHLORIDE CRYS ER 20 MEQ PO TBCR
40.0000 meq | EXTENDED_RELEASE_TABLET | Freq: Once | ORAL | Status: AC
  Administered 2023-06-06: 40 meq via ORAL
  Filled 2023-06-06: qty 2

## 2023-06-06 MED ORDER — DEXTROSE 5 % IV SOLN: INTRAVENOUS | Status: DC

## 2023-06-06 NOTE — TOC CM/SW Note (Signed)
Transition of Care [facility]) - Inpatient Brief Assessment   Patient Details  Name: Amanda Holmes MRN: [id number] Date of Birth: 07-31-1951  Transition of [facility] CM/SW Contact:    Margarito Liner, LCSW Phone Number: 06/06/2023, 8:34 AM   Clinical Narrative: CSW reviewed chart. No TOC needs identified at this time. CSW will continue to follow progress. Please place [facility] consult if any needs arise.  Transition of Care Asessment: Insurance and Status: Insurance coverage has been reviewed Patient has primary care physician: Yes Home environment has been reviewed: Single family home Prior level of function:: Not documented Prior/Current Home Services: No current home services Social Determinants of Health Reivew: SDOH reviewed no interventions necessary Readmission risk has been reviewed: Yes Transition of care needs: no transition of care needs at this time

## 2023-06-06 NOTE — Consult Note (Addendum)
Pharmacy Antibiotic Note  Amanda Holmes is a 72 y.o. female admitted on 06/04/2023 with  Intra-abdominal infection .  Pharmacy has been consulted for pip/tazo.  dosing. WBC trending up, afeb. Concern for pancreatitis. Listed allergy to amoxicillin (hives/rash)  Plan: Recommended to switch pip/tazo to ceftriaxone + flagyl. Pt has received ancef in the past.   Height: 5\' 4"  (162.6 cm) Weight: 114.5 kg (252 lb 6.4 oz) IBW/kg (Calculated) : 54.7  Temp (24hrs), Avg:98.3 F (36.8 C), Min:97.9 F (36.6 C), Max:98.7 F (37.1 C)  Recent Labs  Lab 06/04/23 0425 06/04/23 1031 06/04/23 1456 06/05/23 0340 06/06/23 0451 06/06/23 0452  WBC 9.4 10.0  --  12.4* 15.1*  --   CREATININE 0.85 0.75  --  0.62  --  0.64  LATICACIDVEN  --  1.0 1.0  --   --   --     Estimated Creatinine Clearance: 82.4 mL/min (by C-G formula based on SCr of 0.64 mg/dL).    Allergies  Allergen Reactions   Amoxicillin Hives and Rash   Celebrex [Celecoxib] Anaphylaxis and Shortness Of Breath    Antimicrobials this admission: 6/25 ceftriaxone + flagyl >>   Dose adjustments this admission: None  Microbiology results: None  Thank you for allowing pharmacy to be a part of this patient's care.  Ronnald Ramp, PharmD, BCPS 06/06/2023 9:20 AM

## 2023-06-06 NOTE — Progress Notes (Signed)
[facility] SURGICAL PROGRESS NOTE (cpt [id number])  Hospital Day(s): 2.   Interval History: Patient seen and examined, no acute events or new complaints overnight. Patient reports she continues to have abdominal pain. This remains worse in umbilicus and epigastric region. Also complaining of lower abdominal soreness. Diud have an episode of nausea yesterday. No fever, chills, emesis. Leukocytosis slightly worse this AM; now 15.1K. Renal function normal; sCr - 0.64; UO - unmeasured. No electrolyte derangements. Advanced to CLD; tolerating some.   Review of Systems:  Constitutional: denies fever, chills  HEENT: denies cough or congestion  Respiratory: denies any shortness of breath  Cardiovascular: denies chest pain or palpitations  Gastrointestinal: + abdominal pain; + nausea (once); denied emesis  Genitourinary: denies burning with urination or urinary frequency Musculoskeletal: denies pain, decreased motor or sensation  Vital signs in last 24 hours: [min-max] current  Temp:  [98.1 F (36.7 C)-98.8 F (37.1 C)] 98.1 F (36.7 C) (06/25 0446) Pulse Rate:  [65-73] 69 (06/25 0446) Resp:  [18-20] 18 (06/25 0446) BP: (145-175)/(63-76) 161/66 (06/25 0446) SpO2:  [92 %-97 %] 94 % (06/25 0446) Weight:  [114.7 kg] 114.7 kg (06/24 0807)     Height: 5\' 4"  (162.6 cm) Weight: 114.7 kg BMI (Calculated): 43.39   Intake/Output last 2 shifts:  06/24 0701 - 06/25 0700 In: 1100 [P.O.:1100] Out: -    Physical Exam:  Constitutional: alert, cooperative and no distress  HENT: normocephalic without obvious abnormality  Eyes: PERRL, EOM's grossly intact and symmetric  Respiratory: breathing non-labored at rest  Cardiovascular: regular rate and sinus rhythm  Gastrointestinal: soft, still with upper abdominal pain on examination, fairly significant, and non-distended. No rebound/guarding. She is without peritonitis  Musculoskeletal: no edema or wounds, motor and sensation grossly intact, NT     Labs:     Latest Ref Rng & Units 06/05/2023    3:40 AM 06/04/2023   10:31 AM 06/04/2023    4:25 AM  CBC  WBC 4.0 - 10.5 K/uL 12.4  10.0  9.4   Hemoglobin 12.0 - 15.0 g/dL 60.4  54.0  98.1   Hematocrit 36.0 - 46.0 % 35.6  36.6  39.4   Platelets 150 - 400 K/uL 173  192  216       Latest Ref Rng & Units 06/06/2023    4:52 AM 06/05/2023    3:40 AM 06/04/2023   10:31 AM  CMP  Glucose 70 - 99 mg/dL 191  478  295   BUN 8 - 23 mg/dL 10  11  15    Creatinine 0.44 - 1.00 mg/dL 6.21  3.08  6.57   Sodium 135 - 145 mmol/L 140  142  141   Potassium 3.5 - 5.1 mmol/L 3.6  3.4  3.6   Chloride 98 - 111 mmol/L 107  110  109   CO2 22 - 32 mmol/L 27  26  27    Calcium 8.9 - 10.3 mg/dL 8.9  8.8  8.6   Total Protein 6.5 - 8.1 g/dL  6.2  6.4   Total Bilirubin 0.3 - 1.2 mg/dL  0.8  0.7   Alkaline Phos 38 - 126 U/L  102  124   AST 15 - 41 U/L  36  58   ALT 0 - 44 U/L  28  36      Imaging studies: No new pertinent imaging studies   Assessment/Plan: 72 y.o. female still with significant abdominal pain admitted with gallstone pancreatitis    - Given worsening leukocytosis  and persistent upper abdominal pain on examination, I will repeat CT Abdomen/Pelvis with pancreatic protocol this morning to ensure no missed or worsening intra-abdominal process   - Continue CLD for now - Continue IVF support - Monitor abdominal examination  - No emergent surgical intervention; Anticipate pursuing interval cholecystectomy in outpatient setting once recovered from this insult  - Pain control prn; antiemetics prn - Monitor lipase; improving   - Mobilize as tolerated  - Further management per primary service; we will follow   All of the above findings and recommendations were discussed with the patient, and the medical team, and all of patient's questions were answered to her expressed satisfaction.  -- Lynden Oxford, PA-C [facility] 06/06/2023, 7:07 AM M-F: 7am - 4pm

## 2023-06-06 NOTE — Progress Notes (Signed)
Progress Note   Patient: Amanda Holmes:[id number] DOB: 29-Aug-1951 DOA: 06/04/2023     2 DOS: the patient was seen and examined on 06/06/2023   Brief hospital course: Taken from H&P.  Amanda Holmes is a 72 y.o. female with medical history significant of Atrial fibrillation not on OAC, type II diabetes, obesity, essential hypertension,GERD, hypothyroidism, CHFpef who presents to ED with Substernal chest pain as well as epigastric pain with radiation to her back and shoulder blade.  Patient was admitted for acute pancreatitis with lipase at 3531. RUQ ultrasound with cholelithiasis, no evidence of cholecystitis.  Borderline CBD dilatation without visible choledocholithiasis.  CT abdomen and pelvis with following impression 1. Acute edematous pancreatitis versus mesenteric panniculitis, correlate with serum enzymes. 2. Cholelithiasis.  EDP consulted GI, per Dr. Servando Snare no need for ERCP at this time. General surgery was also consulted, they are recommending cholecystectomy as outpatient once recovered from current illness.  6/24: Blood pressure elevated at 160/70, CMP with improved transaminitis, mild hypokalemia at 3.4, significant improvement in lipase to 314, mild leukocytosis at 12.4.  Patient continued to have significant abdominal pain, started on clear liquid diet.  6/25; worsening abdominal pain and leukocytosis.  Repeat CT abdomen with mild worsening of acute pancreatitis.  No evidence of pancreatic necrosis, pseudo cyst or other complications.  Developed tiny bilateral pleural effusion and trace pelvic ascites. Starting on ceftriaxone and Flagyl   Assessment and Plan: * Acute pancreatitis Initial concern of choledocholithiasis but MRCP was negative.  Per GI no need for ERCP at this time.  Might have passed stone. Improving lipase, continue to have some abdominal pain. Repeat CT abdomen with mild worsening of pancreatitis.  Worsening leukocytosis. -Starting on ceftriaxone  and Flagyl General surgery will consider cholecystectomy as outpatient after recovering from current illness. -Continue with supportive care and pain management -Patient was made n.p.o.  Atrial fibrillation with RVR (HCC) Not on anticoagulation at home.  Heart rate well-controlled -Continue with sotalol  Essential hypertension Blood pressure mildly elevated.  Patient was on Lasix and sotalol at home. Apparently not taking losartan. -Continue sotalol -Restart home Lasix due to slight worsening of peripheral edema -As needed hydralazine   Type 2 diabetes mellitus (HCC) Diet control at home, CBG mostly in mid 100s.  A1c of 7.1 -Very sensitive SSI  GERD (gastroesophageal reflux disease) -Continue PPI  Obesity, Class III, BMI 40-49.9 (morbid obesity) (HCC) Estimated body mass index is 43.41 kg/m as calculated from the following:   Height as of this encounter: 5\' 4"  (1.626 m).   Weight as of this encounter: 114.7 kg.   -This will complicate overall prognosis  Hypothyroidism -Continue home Synthroid   Subjective: Patient continued to have abdominal pain, it was more on lower quadrants today.  Mild nausea but no vomiting. No bowel movement for the past  Physical Exam: Vitals:   06/06/23 0002 06/06/23 0446 06/06/23 0730 06/06/23 0848  BP: (!) 145/63 (!) 161/66 (!) 147/71   Pulse: 65 69 65   Resp: 18 18 18    Temp: 98.2 F (36.8 C) 98.1 F (36.7 C) 97.9 F (36.6 C)   TempSrc:      SpO2: 92% 94% 90%   Weight:    114.5 kg  Height:       General.  Morbidly obese lady, in no acute distress. Pulmonary.  Lungs clear bilaterally, normal respiratory effort. CV.  Regular rate and rhythm, no JVD, rub or murmur. Abdomen.  Soft, nontender, nondistended, BS positive. CNS.  Alert and  oriented .  No focal neurologic deficit. Extremities.  No edema, no cyanosis, pulses intact and symmetrical. Psychiatry.  Judgment and insight appears normal.   Data Reviewed: Prior data  reviewed.  Family Communication: Husband at bedside  Disposition: Status is: Inpatient Remains inpatient appropriate because: Severity of illness.  If continue to improve then discharged tomorrow  Planned Discharge Destination: Home  DVT prophylaxis.  Subcu heparin Time spent: 42 minutes  This record has been created using Conservation officer, historic buildings. Errors have been sought and corrected,but may not always be located. Such creation errors do not reflect on the standard of care.   Author: Arnetha Courser, MD 06/06/2023 3:37 PM  For on call review [email or web address].

## 2023-06-06 NOTE — Progress Notes (Addendum)
Pt blood sugar at 65. Hypoglycemia protocol implemented. MD Para March made aware. Will continue to monitor.  Update 2155: MD Para March placed order. Will continue to monitor.  Update 2236: Blood sugar at 112. MD Para March made aware. Will continue to monitor.

## 2023-06-06 NOTE — Assessment & Plan Note (Signed)
Not on anticoagulation at home.  Heart rate well-controlled -Continue with sotalol

## 2023-06-06 NOTE — Assessment & Plan Note (Signed)
Blood pressure mildly elevated.  Patient was on Lasix and sotalol at home. Apparently not taking losartan. -Continue sotalol -Restart home Lasix due to slight worsening of peripheral edema -As needed hydralazine

## 2023-06-06 NOTE — Assessment & Plan Note (Signed)
Initial concern of choledocholithiasis but MRCP was negative.  Per GI no need for ERCP at this time.  Might have passed stone. Improving lipase, continue to have some abdominal pain. Repeat CT abdomen with mild worsening of pancreatitis.  Worsening leukocytosis. -Starting on ceftriaxone and Flagyl General surgery will consider cholecystectomy as outpatient after recovering from current illness. -Continue with supportive care and pain management -Patient was made n.p.o.

## 2023-06-07 DIAGNOSIS — K851 Biliary acute pancreatitis without necrosis or infection: Secondary | ICD-10-CM | POA: Diagnosis not present

## 2023-06-07 LAB — COMPREHENSIVE METABOLIC PANEL
ALT: 18 U/L (ref 0–44)
AST: 17 U/L (ref 15–41)
Albumin: 3.2 g/dL — ABNORMAL LOW (ref 3.5–5.0)
Alkaline Phosphatase: 87 U/L (ref 38–126)
Anion gap: 8 (ref 5–15)
BUN: 9 mg/dL (ref 8–23)
CO2: 26 mmol/L (ref 22–32)
Calcium: 8.8 mg/dL — ABNORMAL LOW (ref 8.9–10.3)
Chloride: 105 mmol/L (ref 98–111)
Creatinine, Ser: 0.6 mg/dL (ref 0.44–1.00)
GFR, Estimated: >60 mL/min (ref >60)
Glucose, Bld: 82 mg/dL (ref 70–99)
Potassium: 3.5 mmol/L (ref 3.5–5.1)
Sodium: 139 mmol/L (ref 135–145)
Total Bilirubin: 1 mg/dL (ref 0.3–1.2)
Total Protein: 6.3 g/dL — ABNORMAL LOW (ref 6.5–8.1)

## 2023-06-07 LAB — LIPASE, BLOOD: Lipase: 33 U/L (ref 11–51)

## 2023-06-07 LAB — GLUCOSE, CAPILLARY
Glucose-Capillary: 102 mg/dL — ABNORMAL HIGH (ref 70–99)
Glucose-Capillary: 102 mg/dL — ABNORMAL HIGH (ref 70–99)
Glucose-Capillary: 133 mg/dL — ABNORMAL HIGH (ref 70–99)
Glucose-Capillary: 80 mg/dL (ref 70–99)
Glucose-Capillary: 93 mg/dL (ref 70–99)
Glucose-Capillary: 98 mg/dL (ref 70–99)

## 2023-06-07 LAB — CBC
HCT: 35.2 % — ABNORMAL LOW (ref 36.0–46.0)
Hemoglobin: 11.1 g/dL — ABNORMAL LOW (ref 12.0–15.0)
MCH: 30.2 pg (ref 26.0–34.0)
MCHC: 31.5 g/dL (ref 30.0–36.0)
MCV: 95.7 fL (ref 80.0–100.0)
Platelets: 176 10*3/uL (ref 150–400)
RBC: 3.68 MIL/uL — ABNORMAL LOW (ref 3.87–5.11)
RDW: 13.2 % (ref 11.5–15.5)
WBC: 12.5 10*3/uL — ABNORMAL HIGH (ref 4.0–10.5)
nRBC: 0 % (ref 0.0–0.2)

## 2023-06-07 MED ORDER — DEXTROSE IN LACTATED RINGERS 5 % IV SOLN: INTRAVENOUS | Status: AC

## 2023-06-07 MED ORDER — POTASSIUM CHLORIDE CRYS ER 20 MEQ PO TBCR: 40.0000 meq | EXTENDED_RELEASE_TABLET | Freq: Once | ORAL | Status: DC

## 2023-06-07 MED ORDER — SPIRONOLACTONE 12.5 MG HALF TABLET
12.5000 mg | ORAL_TABLET | Freq: Every day | ORAL | Status: DC
  Administered 2023-06-07 – 2023-06-08 (×2): 12.5 mg via ORAL
  Filled 2023-06-07 (×2): qty 1

## 2023-06-07 MED ORDER — FLEET ENEMA 7-19 GM/118ML RE ENEM
1.0000 | ENEMA | Freq: Every day | RECTAL | Status: DC | PRN
  Administered 2023-06-07: 1 via RECTAL

## 2023-06-07 MED ORDER — HYDRALAZINE HCL 25 MG PO TABS: 25.0000 mg | ORAL_TABLET | Freq: Once | ORAL | Status: DC

## 2023-06-07 MED ORDER — HYDRALAZINE HCL 20 MG/ML IJ SOLN
5.0000 mg | Freq: Once | INTRAMUSCULAR | Status: AC
  Administered 2023-06-07: 5 mg via INTRAVENOUS
  Filled 2023-06-07: qty 1

## 2023-06-07 NOTE — Plan of Care (Signed)
°  Problem: Health Behavior/Discharge Planning: °Goal: Ability to manage health-related needs will improve °Outcome: Progressing °  °Problem: Clinical Measurements: °Goal: Will remain free from infection °Outcome: Progressing °  °Problem: Clinical Measurements: °Goal: Respiratory complications will improve °Outcome: Progressing °  °Problem: Clinical Measurements: °Goal: Cardiovascular complication will be avoided °Outcome: Progressing °  °Problem: Elimination: °Goal: Will not experience complications related to bowel motility °Outcome: Progressing °  °Problem: Elimination: °Goal: Will not experience complications related to urinary retention °Outcome: Progressing °  °Problem: Pain Managment: °Goal: General experience of comfort will improve °Outcome: Progressing °  °Problem: Safety: °Goal: Ability to remain free from injury will improve °Outcome: Progressing °  °

## 2023-06-07 NOTE — Plan of Care (Signed)

## 2023-06-07 NOTE — Progress Notes (Signed)
Progress Note   Patient: Amanda Holmes [id number] DOB: [birth year] DOA: 06/04/2023     3 DOS: the patient was seen and examined on 06/07/2023   Brief hospital course: Taken from H&P.  Amanda Holmes is a 72 y.o. female with medical history significant of Atrial fibrillation not on OAC, type II diabetes, obesity, essential hypertension,GERD, hypothyroidism, CHFpef who presents to ED with Substernal chest pain as well as epigastric pain with radiation to her back and shoulder blade.  Patient was admitted for acute pancreatitis with lipase at 3531. RUQ ultrasound with cholelithiasis, no evidence of cholecystitis.  Borderline CBD dilatation without visible choledocholithiasis.  CT abdomen and pelvis with following impression 1. Acute edematous pancreatitis versus mesenteric panniculitis, correlate with serum enzymes. 2. Cholelithiasis.  EDP consulted GI, per Dr. Servando Snare no need for ERCP at this time. General surgery was also consulted, they are recommending cholecystectomy as outpatient once recovered from current illness.  6/24: Blood pressure elevated at 160/70, CMP with improved transaminitis, mild hypokalemia at 3.4, significant improvement in lipase to 314, mild leukocytosis at 12.4.  Patient continued to have significant abdominal pain, started on clear liquid diet.  6/25; worsening abdominal pain and leukocytosis.  Repeat CT abdomen with mild worsening of acute pancreatitis.  No evidence of pancreatic necrosis, pseudo cyst or other complications.  Developed tiny bilateral pleural effusion and trace pelvic ascites. Starting on ceftriaxone and Flagyl.  6/26: Leukocytosis improving.  Worsening abdominal pain and some nausea, mostly lower abdomen, did not had any bowel movement with MiraLAX.  Ordered enema. Willing to try clear liquid diet.  Lipase has been normalized   Assessment and Plan: * Acute pancreatitis Initial concern of choledocholithiasis but MRCP was negative.  Per GI  no need for ERCP at this time.  Might have passed stone. Improving lipase, continue to have some abdominal pain. Repeat CT abdomen with mild worsening of pancreatitis.  Lipase has been normalized.  Leukocytosis started improving -Continue ceftriaxone and Flagyl General surgery will consider cholecystectomy as outpatient after recovering from current illness. -Continue with supportive care and pain management -Started on clear liquid.  Atrial fibrillation with RVR (HCC) Not on anticoagulation at home.  Heart rate well-controlled -Continue with sotalol  Essential hypertension Blood pressure mildly elevated.  Patient was on Lasix and sotalol at home. Apparently not taking losartan. -Continue sotalol -Restart home Lasix due to slight worsening of peripheral edema -As needed hydralazine   Type 2 diabetes mellitus (HCC) Diet control at home, CBG mostly in mid 100s.  A1c of 7.1 -Very sensitive SSI  GERD (gastroesophageal reflux disease) -Continue PPI  Obesity, Class III, BMI 40-49.9 (morbid obesity) (HCC) Estimated body mass index is 43.41 kg/m as calculated from the following:   Height as of this encounter: 5\' 4"  (1.626 m).   Weight as of this encounter: 114.7 kg.   -This will complicate overall prognosis  Hypothyroidism -Continue home Synthroid   Subjective: Patient with worsening lower abdominal pain and nausea overnight.  Did not had any bowel movement with MiraLAX.  Physical Exam: Vitals:   06/07/23 0731 06/07/23 0859 06/07/23 1246 06/07/23 1701  BP: (!) 159/79 (!) 157/71 (!) 142/64 (!) 159/68  Pulse:  71 64 61  Resp:  16 16 16   Temp:  98.1 F (36.7 C) 97.9 F (36.6 C) 98.1 F (36.7 C)  TempSrc:    Oral  SpO2:  99% 96% 97%  Weight:      Height:       General.  Morbidly obese lady,  in no acute distress. Pulmonary.  Lungs clear bilaterally, normal respiratory effort. CV.  Regular rate and rhythm, no JVD, rub or murmur. Abdomen.  Soft, nontender, nondistended, BS  positive. CNS.  Alert and oriented .  No focal neurologic deficit. Extremities.  No edema, no cyanosis, pulses intact and symmetrical. Psychiatry.  Judgment and insight appears normal.   Data Reviewed: Prior data reviewed.  Family Communication: Husband at bedside  Disposition: Status is: Inpatient Remains inpatient appropriate because: Severity of illness.  If continue to improve then discharged tomorrow  Planned Discharge Destination: Home  DVT prophylaxis.  Subcu heparin Time spent: 40 minutes  This record has been created using Conservation officer, historic buildings. Errors have been sought and corrected,but may not always be located. Such creation errors do not reflect on the standard of care.   Author: Arnetha Courser, MD 06/07/2023 5:19 PM  For on call review [email or web address].

## 2023-06-07 NOTE — Care Management Important Message (Signed)
Important Message  Patient Details  Name: Amanda Holmes MRN: [id number] Date of Birth: Sep 27, 1951   Medicare Important Message Given:  Yes  Initial consent for Medicare IM obtained.  Copy of Medicare IM left in patient's room for reference, original to be scanned into chart.   Johnell Comings 06/07/2023, 10:19 AM

## 2023-06-07 NOTE — Progress Notes (Addendum)
Pt BP was at 175/78 MAP 107 HR 71 administered PRN hydralzine 25 mg at 0451; BP 186/78 MAP 109 HR 74 at 0600.. MD Para March made aware, Will continue to monitor.  Update 5409: MD Para March placed order. Will continue to monitor.

## 2023-06-07 NOTE — Progress Notes (Signed)
[facility] SUR[facility]ICAL PROGRESS NOTE (cpt [phone])  Hospital Day(s): 3.   Interval History: Patient seen and examined, no acute events or new complaints overnight. Patient reports she continues to have abdominal pain; no significant improvement. Still in upper and central abdomen. No fever, chills, emesis. Leukocytosis improving this AM; now 12.5K. Renal function normal; sCr - 0.60; UO - unmeasured. No electrolyte derangements. Backed down to NPO given pain.   Review of Systems:  Constitutional: denies fever, chills  HEENT: denies cough or congestion  Respiratory: denies any shortness of breath  Cardiovascular: denies chest pain or palpitations  Gastrointestinal: + abdominal pain; + nausea (once); denied emesis  Genitourinary: denies burning with urination or urinary frequency Musculoskeletal: denies pain, decreased motor or sensation  Vital signs in last 24 hours: [min-max] current  Temp:  [97.9 F (36.6 C)-98.3 F (36.8 C)] 97.9 F (36.6 C) (06/26 0445) Pulse Rate:  [63-72] 71 (06/26 0445) Resp:  [18] 18 (06/26 0445) BP: (152-186)/(65-81) 159/79 (06/26 0731) SpO2:  [92 %-96 %] 95 % (06/26 0445) Weight:  [114.5 kg] 114.5 kg (06/25 0848)     Height: 5\' 4"  (162.6 cm) Weight: 114.5 kg BMI (Calculated): 43.3   Intake/Output last 2 shifts:  06/25 0701 - 06/26 0700 In: 798.3 [P.O.:240; I.V.:458.3; IV Piggyback:100] Out: -    Physical Exam:  Constitutional: alert, cooperative and no distress  HENT: normocephalic without obvious abnormality  Eyes: PERRL, EOM's grossly intact and symmetric  Respiratory: breathing non-labored at rest  Cardiovascular: regular rate and sinus rhythm  Gastrointestinal: soft, still with upper abdominal pain on examination, without significant improvement, and non-distended. No rebound/guarding. She is without peritonitis  Musculoskeletal: no edema or wounds, motor and sensation grossly intact, NT    Labs:     Latest Ref Rng & Units  06/07/2023    4:09 AM 06/06/2023    4:51 AM 06/05/2023    3:40 AM  CBC  WBC 4.0 - 10.5 K/uL 12.5  15.1  12.4   Hemoglobin 12.0 - 15.0 g/dL 46.9  62.9  52.8   Hematocrit 36.0 - 46.0 % 35.2  35.4  35.6   Platelets 150 - 400 K/uL 176  175  173       Latest Ref Rng & Units 06/07/2023    4:09 AM 06/06/2023    4:52 AM 06/05/2023    3:40 AM  CMP  Glucose 70 - 99 mg/dL 82  413  244   BUN 8 - 23 mg/dL 9  10  11    Creatinine 0.44 - 1.00 mg/dL 0.10  2.72  5.36   Sodium 135 - 145 mmol/L 139  140  142   Potassium 3.5 - 5.1 mmol/L 3.5  3.6  3.4   Chloride 98 - 111 mmol/L 105  107  110   CO2 22 - 32 mmol/L 26  27  26    Calcium 8.9 - 10.3 mg/dL 8.8  8.9  8.8   Total Protein 6.5 - 8.1 g/dL 6.3   6.2   Total Bilirubin 0.3 - 1.2 mg/dL 1.0   0.8   Alkaline Phos 38 - 126 U/L 87   102   AST 15 - 41 U/L 17   36   ALT 0 - 44 U/L 18   28      Imaging studies:   CT Abdomen/Pelvis (06/06/2023) personally reviewed with continued progression of peripancreatic inflammation, no significant cholecystitis, and radiologist report reviewed below:  IMPRESSION: Mild worsening of acute pancreatitis. No evidence of pancreatic necrosis,  pseudocyst, or other complication.   Cholelithiasis. No radiographic evidence of cholecystitis or biliary ductal dilatation.   New tiny bilateral pleural effusions and dependent atelectasis, and trace pelvic ascites.    Assessment/Plan: 72 y.o. female still with significant abdominal pain admitted with gallstone pancreatitis    - Pain certainly still related to pancreatitis given CT findings but fortunately without any evidence of pseudocyst, necrosis. For now, given her continued pain, we will continue to hold of on any surgical intervention regarding her gallbladder this admission. She understands we will pursue cholecystectomy as any outpatient once recovered from this insult.   - NPO until pain resolves/improves - Continue IVF support - Monitor abdominal examination - Pain  control prn; antiemetics prn  - Mobilize as tolerated  - Further management per primary service   - Right now, nothing acute to do from general surgery perspective. We will follow peripherally for now. Please call or message with questions/concerns   All of the above findings and recommendations were discussed with the patient, and the medical team, and all of patient's questions were answered to her expressed satisfaction.  -- Lynden Oxford, PA-C [facility] 06/07/2023, 8:31 AM M-F: 7am - 4pm

## 2023-06-08 DIAGNOSIS — I10 Essential (primary) hypertension: Secondary | ICD-10-CM

## 2023-06-08 DIAGNOSIS — E119 Type 2 diabetes mellitus without complications: Secondary | ICD-10-CM

## 2023-06-08 DIAGNOSIS — K851 Biliary acute pancreatitis without necrosis or infection: Secondary | ICD-10-CM | POA: Diagnosis not present

## 2023-06-08 DIAGNOSIS — I4891 Unspecified atrial fibrillation: Secondary | ICD-10-CM

## 2023-06-08 DIAGNOSIS — R1010 Upper abdominal pain, unspecified: Secondary | ICD-10-CM

## 2023-06-08 DIAGNOSIS — K219 Gastro-esophageal reflux disease without esophagitis: Secondary | ICD-10-CM

## 2023-06-08 LAB — MAGNESIUM: Magnesium: 2.2 mg/dL (ref 1.7–2.4)

## 2023-06-08 LAB — GLUCOSE, CAPILLARY
Glucose-Capillary: 120 mg/dL — ABNORMAL HIGH (ref 70–99)
Glucose-Capillary: 121 mg/dL — ABNORMAL HIGH (ref 70–99)
Glucose-Capillary: 123 mg/dL — ABNORMAL HIGH (ref 70–99)
Glucose-Capillary: 150 mg/dL — ABNORMAL HIGH (ref 70–99)

## 2023-06-08 LAB — BASIC METABOLIC PANEL
Anion gap: 6 (ref 5–15)
BUN: 8 mg/dL (ref 8–23)
CO2: 26 mmol/L (ref 22–32)
Calcium: 8.2 mg/dL — ABNORMAL LOW (ref 8.9–10.3)
Chloride: 106 mmol/L (ref 98–111)
Creatinine, Ser: 0.64 mg/dL (ref 0.44–1.00)
GFR, Estimated: >60 mL/min (ref >60)
Glucose, Bld: 150 mg/dL — ABNORMAL HIGH (ref 70–99)
Potassium: 3.2 mmol/L — ABNORMAL LOW (ref 3.5–5.1)
Sodium: 138 mmol/L (ref 135–145)

## 2023-06-08 MED ORDER — POLYETHYLENE GLYCOL 3350 17 G PO PACK: 17.0000 g | PACK | Freq: Every day | ORAL | 0 refills | Status: DC

## 2023-06-08 MED ORDER — SPIRONOLACTONE 25 MG PO TABS: 12.5000 mg | ORAL_TABLET | Freq: Every day | ORAL | 1 refills | Status: AC

## 2023-06-08 MED ORDER — POTASSIUM CHLORIDE CRYS ER 20 MEQ PO TBCR
40.0000 meq | EXTENDED_RELEASE_TABLET | Freq: Once | ORAL | Status: AC
  Administered 2023-06-08: 40 meq via ORAL
  Filled 2023-06-08: qty 2

## 2023-06-08 MED ORDER — CIPROFLOXACIN HCL 500 MG PO TABS: 500.0000 mg | ORAL_TABLET | Freq: Two times a day (BID) | ORAL | 0 refills | Status: AC

## 2023-06-08 MED ORDER — METRONIDAZOLE 500 MG PO TABS: 500.0000 mg | ORAL_TABLET | Freq: Two times a day (BID) | ORAL | 0 refills | Status: AC

## 2023-06-08 NOTE — Progress Notes (Signed)
Amanda Holmes  A and O x 4. VSS. Pt tolerating diet well. No complaints of pain or nausea. IV removed intact, prescriptions given. Pt voiced understanding of discharge instructions with no further questions. Pt discharged via wheelchair with axillary.    Allergies as of 06/08/2023       Reactions   Amoxicillin Hives, Rash   Celebrex [celecoxib] Anaphylaxis, Shortness Of Breath        Medication List     STOP taking these medications    losartan 100 MG tablet Commonly known as: COZAAR       TAKE these medications    acetaminophen 500 MG tablet Commonly known as: TYLENOL Take 500-1,000 mg by mouth 2 (two) times daily. 2 tabs in the am, 1 tab at bedtime   ciprofloxacin 500 MG tablet Commonly known as: Cipro Take 1 tablet (500 mg total) by mouth 2 (two) times daily for 3 days.   DULoxetine 30 MG capsule Commonly known as: CYMBALTA Take 30 mg by mouth daily.   furosemide 40 MG tablet Commonly known as: LASIX Take 40 mg by mouth daily.   Klor-Con M20 20 MEQ tablet Generic drug: potassium chloride SA Take 20 mEq by mouth as directed.   levothyroxine 175 MCG tablet Commonly known as: SYNTHROID Take 175 mcg by mouth daily before breakfast.   loratadine 10 MG tablet Commonly known as: CLARITIN Take 10 mg by mouth daily.   metroNIDAZOLE 500 MG tablet Commonly known as: Flagyl Take 1 tablet (500 mg total) by mouth 2 (two) times daily for 3 days.   polyethylene glycol 17 g packet Commonly known as: MIRALAX / GLYCOLAX Take 17 g by mouth daily. Start taking on: June 09, 2023   SOTALOL AF 80 MG Tabs Take 80 mg by mouth daily.   spironolactone 25 MG tablet Commonly known as: ALDACTONE Take 0.5 tablets (12.5 mg total) by mouth daily. Start taking on: June 09, 2023        Vitals:   06/08/23 0812 06/08/23 1203  BP: (!) 156/68 (!) 149/80  Pulse: 68 65  Resp: 18 18  Temp: 97.7 F (36.5 C) 98 F (36.7 C)  SpO2: 100% 99%    Amanda Holmes

## 2023-06-08 NOTE — Discharge Summary (Signed)
Physician Discharge Summary   Patient: Amanda Holmes MRN: [id number] DOB: 1951/10/05  Admit date:     06/04/2023  Discharge date: 06/08/23  Discharge Physician: Arnetha Courser   PCP: Lynnea Ferrier, MD   Recommendations at discharge:  Please obtain CBC and CMP in 1 week Follow-up with primary care provider Follow-up with general surgery  Discharge Diagnoses: Principal Problem:   Acute pancreatitis Active Problems:   Atrial fibrillation with RVR (HCC)   Essential hypertension   Type 2 diabetes mellitus (HCC)   GERD (gastroesophageal reflux disease)   Obesity, Class III, BMI 40-49.9 (morbid obesity) (HCC)   Hypothyroidism   Upper abdominal pain   Hospital Course: Taken from H&P.  Blannie A Gilberto is a 72 y.o. female with medical history significant of Atrial fibrillation not on OAC, type II diabetes, obesity, essential hypertension,GERD, hypothyroidism, CHFpef who presents to ED with Substernal chest pain as well as epigastric pain with radiation to her back and shoulder blade.  Patient was admitted for acute pancreatitis with lipase at 3531. RUQ ultrasound with cholelithiasis, no evidence of cholecystitis.  Borderline CBD dilatation without visible choledocholithiasis.  CT abdomen and pelvis with following impression 1. Acute edematous pancreatitis versus mesenteric panniculitis, correlate with serum enzymes. 2. Cholelithiasis.  EDP consulted GI, per Dr. Servando Snare no need for ERCP at this time. General surgery was also consulted, they are recommending cholecystectomy as outpatient once recovered from current illness.  6/24: Blood pressure elevated at 160/70, CMP with improved transaminitis, mild hypokalemia at 3.4, significant improvement in lipase to 314, mild leukocytosis at 12.4.  Patient continued to have significant abdominal pain, started on clear liquid diet.  6/25; worsening abdominal pain and leukocytosis.  Repeat CT abdomen with mild worsening of acute  pancreatitis.  No evidence of pancreatic necrosis, pseudo cyst or other complications.  Developed tiny bilateral pleural effusion and trace pelvic ascites. Starting on ceftriaxone and Flagyl.  6/26: Leukocytosis improving.  Worsening abdominal pain and some nausea, mostly lower abdomen, did not had any bowel movement with MiraLAX.  Ordered enema. Willing to try clear liquid diet.  Lipase has been normalized.  6/27: Patient feeling much improved.  Able to tolerate soft diet.  Had a good bowel movement with enema which helped with her abdominal pain.  She wants to go home and is being discharged on 3 more days of Cipro and Flagyl to complete the antibiotic course.  She was also started on spironolactone to help with her swelling and blood pressure.  She was advised to slowly advance her diet, and avoid constipation.  Patient will continue on current medications and need to have a close follow-up with her providers which include general surgery for elective cholecystectomy.  Assessment and Plan: * Acute pancreatitis Initial concern of choledocholithiasis but MRCP was negative.  Per GI no need for ERCP at this time.  Might have passed stone. Improving lipase, continue to have some abdominal pain. Repeat CT abdomen with mild worsening of pancreatitis.  Lipase has been normalized.  Leukocytosis started improving -Continue ceftriaxone and Flagyl General surgery will consider cholecystectomy as outpatient after recovering from current illness. -Continue with supportive care and pain management -Started on clear liquid.  Atrial fibrillation with RVR (HCC) Not on anticoagulation at home.  Heart rate well-controlled -Continue with sotalol  Essential hypertension Blood pressure mildly elevated.  Patient was on Lasix and sotalol at home. Apparently not taking losartan. -Continue sotalol -Restart home Lasix due to slight worsening of peripheral edema -As needed hydralazine  Type 2 diabetes  mellitus (HCC) Diet control at home, CBG mostly in mid 100s.  A1c of 7.1 -Very sensitive SSI  GERD (gastroesophageal reflux disease) -Continue PPI  Obesity, Class III, BMI 40-49.9 (morbid obesity) (HCC) Estimated body mass index is 43.41 kg/m as calculated from the following:   Height as of this encounter: 5\' 4"  (1.626 m).   Weight as of this encounter: 114.7 kg.   -This will complicate overall prognosis  Hypothyroidism -Continue home Synthroid   Consultants: General surgery Procedures performed: None Disposition: Home Diet recommendation:  Discharge Diet Orders (From admission, onward)     Start     Ordered   06/08/23 0000  Diet - low sodium heart healthy        06/08/23 1106           Cardiac and Carb modified diet DISCHARGE MEDICATION: Allergies as of 06/08/2023       Reactions   Amoxicillin Hives, Rash   Celebrex [celecoxib] Anaphylaxis, Shortness Of Breath        Medication List     STOP taking these medications    losartan 100 MG tablet Commonly known as: COZAAR       TAKE these medications    acetaminophen 500 MG tablet Commonly known as: TYLENOL Take 500-1,000 mg by mouth 2 (two) times daily. 2 tabs in the am, 1 tab at bedtime   ciprofloxacin 500 MG tablet Commonly known as: Cipro Take 1 tablet (500 mg total) by mouth 2 (two) times daily for 3 days.   DULoxetine 30 MG capsule Commonly known as: CYMBALTA Take 30 mg by mouth daily.   furosemide 40 MG tablet Commonly known as: LASIX Take 40 mg by mouth daily.   Klor-Con M20 20 MEQ tablet Generic drug: potassium chloride SA Take 20 mEq by mouth as directed.   levothyroxine 175 MCG tablet Commonly known as: SYNTHROID Take 175 mcg by mouth daily before breakfast.   loratadine 10 MG tablet Commonly known as: CLARITIN Take 10 mg by mouth daily.   metroNIDAZOLE 500 MG tablet Commonly known as: Flagyl Take 1 tablet (500 mg total) by mouth 2 (two) times daily for 3 days.    polyethylene glycol 17 g packet Commonly known as: MIRALAX / GLYCOLAX Take 17 g by mouth daily. Start taking on: June 09, 2023   SOTALOL AF 80 MG Tabs Take 80 mg by mouth daily.   spironolactone 25 MG tablet Commonly known as: ALDACTONE Take 0.5 tablets (12.5 mg total) by mouth daily. Start taking on: June 09, 2023        Follow-up Information     Curtis Sites III, MD. Schedule an appointment as soon as possible for a visit in 1 week(s).   Specialty: Internal Medicine Contact information: [address] [facility] [place] [address] [phone]         Henrene Dodge, MD. Schedule an appointment as soon as possible for a visit in 1 week(s).   Specialty: General Surgery Contact information: [address] [place] [address] [phone]                Discharge Exam: Ceasar Mons Weights   06/04/23 0418 06/05/23 0807 06/06/23 0848  Weight: 111.6 kg 114.7 kg 114.5 kg   General.  Obese lady, in no acute distress. Pulmonary.  Lungs clear bilaterally, normal respiratory effort. CV.  Regular rate and rhythm, no JVD, rub or murmur. Abdomen.  Soft, nontender, nondistended, BS positive. CNS.  Alert  and oriented .  No focal neurologic deficit. Extremities.  No edema, no cyanosis, pulses intact and symmetrical. Psychiatry.  Judgment and insight appears normal.   Condition at discharge: stable  The results of significant diagnostics from this hospitalization (including imaging, microbiology, ancillary and laboratory) are listed below for reference.   Imaging Studies: CT ABDOMEN PELVIS W CONTRAST  Result Date: 06/06/2023 CLINICAL DATA:  Follow-up acute pancreatitis. EXAM: CT ABDOMEN AND PELVIS WITH CONTRAST TECHNIQUE: Multidetector CT imaging of the abdomen and pelvis was performed using the standard protocol following bolus administration of intravenous contrast. RADIATION DOSE REDUCTION: This exam was performed according to the  departmental dose-optimization program which includes automated exposure control, adjustment of the mA and/or kV according to patient size and/or use of iterative reconstruction technique. CONTRAST:  OMNIPAQUE IOHEXOL 300 MG/ML  SOLN COMPARISON:  06/04/2023 FINDINGS: Lower Chest: New tiny bilateral pleural effusions and dependent atelectasis. Hepatobiliary: No suspicious hepatic masses identified. Tiny calcified gallstones again noted, without evidence of cholecystitis or biliary ductal dilatation. Pancreas: Mild increase in diffuse pancreatic swelling and peripancreatic soft tissue stranding, consistent with mild worsening of acute pancreatitis. No evidence of pancreatic necrosis, pseudocyst, or mass. No evidence of pancreatic ductal dilatation. Spleen: Within normal limits in size and appearance. Adrenals/Urinary Tract: No suspicious masses identified. No evidence of ureteral calculi or hydronephrosis. Stomach/Bowel: No evidence of obstruction, inflammatory process or abnormal fluid collections. Vascular/Lymphatic: No pathologically enlarged lymph nodes. No acute vascular findings. Reproductive:  No mass or other significant abnormality. Other: Tiny amount of free fluid in the pelvis. No evidence of pseudocyst or abscess. Musculoskeletal:  No suspicious bone lesions identified. IMPRESSION: Mild worsening of acute pancreatitis. No evidence of pancreatic necrosis, pseudocyst, or other complication. Cholelithiasis. No radiographic evidence of cholecystitis or biliary ductal dilatation. New tiny bilateral pleural effusions and dependent atelectasis, and trace pelvic ascites. Electronically Signed   By: Danae Orleans M.D.   On: 06/06/2023 09:35   MR ABDOMEN MRCP WO CONTRAST  Result Date: 06/04/2023 CLINICAL DATA:  Pancreatitis. EXAM: MRI ABDOMEN WITHOUT CONTRAST  (INCLUDING MRCP) TECHNIQUE: Multiplanar multisequence MR imaging of the abdomen was performed. Heavily T2-weighted images of the biliary and  pancreatic ducts were obtained, and three-dimensional MRCP images were rendered by post processing. COMPARISON:  CT scan 06/04/2023 FINDINGS: Lower chest: Unremarkable. Hepatobiliary: No suspicious focal abnormality in the liver on this study without intravenous contrast. Layering tiny stones evident. Gallbladder wall thickness is upper normal to borderline enlarged. No intrahepatic or extrahepatic biliary dilation. Common duct diameter is 6 mm. Common bile duct diameter in the head of the pancreas is 4 mm. No evidence for choledocholithiasis. Pancreas: Diffuse pancreatic edema noted with head and body most affected. There is. Pancreatic edema tracking in the anterior pararenal space. Probable extension of edema into the transverse mesocolon. No focal fluid collection evident. Spleen:  No splenomegaly. No suspicious focal mass lesion. Adrenals/Urinary Tract: No adrenal nodule or mass. Kidneys unremarkable. Stomach/Bowel: Stomach is unremarkable. No gastric wall thickening. No evidence of outlet obstruction. Duodenum is normally positioned as is the ligament of Treitz. No small bowel or colonic dilatation within the visualized abdomen. Vascular/Lymphatic: No abdominal aortic aneurysm. No abdominal lymphadenopathy Other: No substantial intraperitoneal free fluid. There may be some trace fluid along the inferior margin of the liver. Musculoskeletal: No suspicious marrow signal abnormality. IMPRESSION: 1. Diffuse pancreatic edema with head and body most affected. There is edema tracking in the anterior pararenal space and probable extension of edema into the transverse mesocolon. No focal fluid  collection evident. Imaging features are compatible with acute pancreatitis. 2. Cholelithiasis with upper normal to borderline gallbladder wall thickness. No intrahepatic or extrahepatic biliary dilation. No evidence for choledocholithiasis. Electronically Signed   By: Kennith Center M.D.   On: 06/04/2023 10:59   MR 3D Recon At  Scanner  Result Date: 06/04/2023 CLINICAL DATA:  Pancreatitis. EXAM: MRI ABDOMEN WITHOUT CONTRAST  (INCLUDING MRCP) TECHNIQUE: Multiplanar multisequence MR imaging of the abdomen was performed. Heavily T2-weighted images of the biliary and pancreatic ducts were obtained, and three-dimensional MRCP images were rendered by post processing. COMPARISON:  CT scan 06/04/2023 FINDINGS: Lower chest: Unremarkable. Hepatobiliary: No suspicious focal abnormality in the liver on this study without intravenous contrast. Layering tiny stones evident. Gallbladder wall thickness is upper normal to borderline enlarged. No intrahepatic or extrahepatic biliary dilation. Common duct diameter is 6 mm. Common bile duct diameter in the head of the pancreas is 4 mm. No evidence for choledocholithiasis. Pancreas: Diffuse pancreatic edema noted with head and body most affected. There is. Pancreatic edema tracking in the anterior pararenal space. Probable extension of edema into the transverse mesocolon. No focal fluid collection evident. Spleen:  No splenomegaly. No suspicious focal mass lesion. Adrenals/Urinary Tract: No adrenal nodule or mass. Kidneys unremarkable. Stomach/Bowel: Stomach is unremarkable. No gastric wall thickening. No evidence of outlet obstruction. Duodenum is normally positioned as is the ligament of Treitz. No small bowel or colonic dilatation within the visualized abdomen. Vascular/Lymphatic: No abdominal aortic aneurysm. No abdominal lymphadenopathy Other: No substantial intraperitoneal free fluid. There may be some trace fluid along the inferior margin of the liver. Musculoskeletal: No suspicious marrow signal abnormality. IMPRESSION: 1. Diffuse pancreatic edema with head and body most affected. There is edema tracking in the anterior pararenal space and probable extension of edema into the transverse mesocolon. No focal fluid collection evident. Imaging features are compatible with acute pancreatitis. 2.  Cholelithiasis with upper normal to borderline gallbladder wall thickness. No intrahepatic or extrahepatic biliary dilation. No evidence for choledocholithiasis. Electronically Signed   By: Kennith Center M.D.   On: 06/04/2023 10:59   US ABDOMEN LIMITED RUQ (LIVER/GB)  Result Date: 06/04/2023 CLINICAL DATA:  Pancreatitis with upper abdominal pain EXAM: ULTRASOUND ABDOMEN LIMITED RIGHT UPPER QUADRANT COMPARISON:  Abdominal CT from earlier today FINDINGS: Gallbladder: Layering calcification for multiple small calculi. Cholesterolosis with ring down artifact at the anterior wall of the gallbladder. No focal tenderness or pericholecystic edema. Common bile duct: Diameter: 8 mm, borderline dilated for age. Where visualized, no filling defect. Liver: No focal lesion identified. Within normal limits in parenchymal echogenicity. Portal vein is patent on color Doppler imaging with normal direction of blood flow towards the liver. IMPRESSION: 1. Cholelithiasis without evidence of cholecystitis. 2. Borderline CBD dilatation without visible choledocholithiasis. Electronically Signed   By: Tiburcio Pea M.D.   On: 06/04/2023 07:32   CT Angio Chest PE W/Cm &/Or Wo Cm  Result Date: 06/04/2023 CLINICAL DATA:  Epigastric chest pain that radiates to the left breast into her back between the shoulder blades. EXAM: CT ANGIOGRAPHY CHEST WITH CONTRAST TECHNIQUE: Multidetector CT imaging of the chest was performed using the standard protocol during bolus administration of intravenous contrast. Multiplanar CT image reconstructions and MIPs were obtained to evaluate the vascular anatomy. RADIATION DOSE REDUCTION: This exam was performed according to the departmental dose-optimization program which includes automated exposure control, adjustment of the mA and/or kV according to patient size and/or use of iterative reconstruction technique. CONTRAST:  OMNIPAQUE IOHEXOL 350 MG/ML SOLN COMPARISON:  11/03/2020 FINDINGS:  Cardiovascular: The heart size is normal. No substantial pericardial effusion. Mild atherosclerotic calcification is noted in the wall of the thoracic aorta. There is no filling defect within the opacified pulmonary arteries to suggest the presence of an acute pulmonary embolus. Mediastinum/Nodes: No mediastinal lymphadenopathy. There is no hilar lymphadenopathy. The esophagus has normal imaging features. There is no axillary lymphadenopathy. Lungs/Pleura: No suspicious pulmonary nodule or mass. No focal airspace consolidation. No pleural effusion. Upper Abdomen: Subtle irregularity of liver contour raises the question of cirrhosis. Layering tiny calcified gallstones evident. Subtle edema/stranding is noted anterior to the pancreas and extending into the transverse mesocolon. No main duct dilatation the pancreas. Pancreatic parenchyma incompletely visualized. Musculoskeletal: No worrisome lytic or sclerotic osseous abnormality. Review of the MIP images confirms the above findings. IMPRESSION: 1. No CT evidence for acute pulmonary embolus. 2. Subtle edema/stranding anterior to the pancreas and extending into the transverse mesocolon. Imaging features are concerning for acute pancreatitis. Correlation with amylase/lipase may prove helpful. 3. Subtle irregularity of liver contour raises the question of cirrhosis. 4. Cholelithiasis. 5.  Aortic Atherosclerosis (ICD10-I70.0). Electronically Signed   By: Kennith Center M.D.   On: 06/04/2023 06:02   CT ABDOMEN PELVIS W CONTRAST  Result Date: 06/04/2023 CLINICAL DATA:  Acute, nonlocalized abdominal pain EXAM: CT ABDOMEN AND PELVIS WITH CONTRAST TECHNIQUE: Multidetector CT imaging of the abdomen and pelvis was performed using the standard protocol following bolus administration of intravenous contrast. RADIATION DOSE REDUCTION: This exam was performed according to the departmental dose-optimization program which includes automated exposure control, adjustment of the mA  and/or kV according to patient size and/or use of iterative reconstruction technique. CONTRAST:  OMNIPAQUE IOHEXOL 350 MG/ML SOLN COMPARISON:  Renal stone CT 07/23/2022 FINDINGS: Lower chest:  No contributory findings. Hepatobiliary: No focal liver abnormality.Layering gallstones. No evidence of acute cholecystitis. No biliary dilatation Pancreas: Fat stranding at the root of the small bowel mesentery. The pancreas has an indistinct margination and slight diffuse fullness. No necrosis or collection. Spleen: Unremarkable. Adrenals/Urinary Tract: Negative adrenals. No hydronephrosis or stone. Unremarkable bladder. Stomach/Bowel:  No obstruction. No appendicitis. Vascular/Lymphatic: No acute vascular abnormality. Fat stranding at the root of the small bowel mesentery. No mass or adenopathy. Reproductive:No pathologic findings. Other: No ascites or pneumoperitoneum. Musculoskeletal: No acute abnormalities. IMPRESSION: 1. Acute edematous pancreatitis versus mesenteric panniculitis, correlate with serum enzymes. 2. Cholelithiasis. Electronically Signed   By: Tiburcio Pea M.D.   On: 06/04/2023 06:00   DG Chest Port 1 View  Result Date: 06/04/2023 CLINICAL DATA:  Chest pain EXAM: PORTABLE CHEST 1 VIEW COMPARISON:  07/23/2022 FINDINGS: Normal heart size and mediastinal contours for portable technique. Density at the right apex is from costochondral junction and stable from prior. There is no edema, consolidation, effusion, or pneumothorax. IMPRESSION: No evidence of active disease. Electronically Signed   By: Tiburcio Pea M.D.   On: 06/04/2023 04:58    Microbiology: Results for orders placed or performed during the hospital encounter of 07/23/22  Urine Culture     Status: Abnormal   Collection Time: 07/23/22  5:32 PM   Specimen: Urine, Clean Catch  Result Value Ref Range Status   Specimen Description   Final    URINE, CLEAN CATCH Performed at [facility], [address] [place],  [place] [address]    Special Requests   Final    NONE Performed at [facility], [address], [place], [place] [address]    Culture (A)  Final    <  10,000 COLONIES/mL INSIGNIFICANT GROWTH Performed at [facility], [address], [place], [place] [address]    Report Status 07/25/2022 FINAL  Final    Labs: CBC: Recent Labs  Lab 06/04/23 0425 06/04/23 1031 06/05/23 0340 06/06/23 0451 06/07/23 0409  WBC 9.4 10.0 12.4* 15.1* 12.5*  NEUTROABS  --  6.2  --   --   --   HGB 12.8 11.7* 11.3* 11.1* 11.1*  HCT 39.4 36.6 35.6* 35.4* 35.2*  MCV 93.1 94.3 94.9 95.9 95.7  PLT 216 192 173 175 176   Basic Metabolic Panel: Recent Labs  Lab 06/04/23 1031 06/05/23 0340 06/06/23 0452 06/07/23 0409 06/08/23 0400  NA 141 142 140 139 138  K 3.6 3.4* 3.6 3.5 3.2*  CL 109 110 107 105 106  CO2 27 26 27 26 26   GLUCOSE 142* 129* 107* 82 150*  BUN 15 11 10 9 8   CREATININE 0.75 0.62 0.64 0.60 0.64  CALCIUM 8.6* 8.8* 8.9 8.8* 8.2*  MG  --   --  2.2  --  2.2   Liver Function Tests: Recent Labs  Lab 06/04/23 0425 06/04/23 1031 06/05/23 0340 06/07/23 0409  AST 81* 58* 36 17  ALT 41 36 28 18  ALKPHOS 139* 124 102 87  BILITOT 1.3* 0.7 0.8 1.0  PROT 7.4 6.4* 6.2* 6.3*  ALBUMIN 4.1 3.5 3.5 3.2*   CBG: Recent Labs  Lab 06/07/23 1929 06/08/23 0024 06/08/23 0335 06/08/23 0844 06/08/23 1108  GLUCAP 133* 123* 150* 121* 120*    Discharge time spent: greater than 30 minutes.  This record has been created using Conservation officer, historic buildings. Errors have been sought and corrected,but may not always be located. Such creation errors do not reflect on the standard of care.   Signed: Arnetha Courser, MD [facility] 06/08/2023

## 2023-06-21 ENCOUNTER — Encounter: Payer: Self-pay | Admitting: Surgery

## 2023-06-21 ENCOUNTER — Ambulatory Visit: Payer: Medicare HMO | Admitting: Surgery

## 2023-06-21 VITALS — BP 148/83 | HR 82 | Temp 98.3°F | Ht 64.0 in | Wt 229.6 lb

## 2023-06-21 DIAGNOSIS — K851 Biliary acute pancreatitis without necrosis or infection: Secondary | ICD-10-CM | POA: Diagnosis not present

## 2023-06-21 NOTE — Patient Instructions (Addendum)
Our surgery scheduler Britta Mccreedy will call you within 24-48 hours to get you scheduled. If you have not heard from her after 48 hours, please call our office. Have the blue sheet available when she calls to write down important information.   If you have any concerns or questions, please feel free to call our office. See follow up appointment.   Gallbladder Eating Plan High blood cholesterol, obesity, a sedentary lifestyle, an unhealthy diet, and diabetes are risk factors for developing gallstones. If you have a gallbladder condition, you may have trouble digesting fats and tolerating high fat intake. Eating a low-fat diet can help reduce your symptoms and may be helpful before and after having surgery to remove your gallbladder (cholecystectomy). Your health care provider may recommend that you work with a dietitian to help you reduce the amount of fat in your diet. What are tips for following this plan? General guidelines Limit your fat intake to less than 30% of your total daily calories. If you eat around 1,800 calories each day, this means eating less than 60 grams (g) of fat per day. Fat is an important part of a healthy diet. Eating a low-fat diet can make it hard to maintain a healthy body weight. Ask your dietitian how much fat, calories, and other nutrients you need each day. Eat small, frequent meals throughout the day instead of three large meals. Drink at least 8-10 cups (1.9-2.4 L) of fluid a day. Drink enough fluid to keep your urine pale yellow. If you drink alcohol: Limit how much you have to: 0-1 drink a day for women who are not pregnant. 0-2 drinks a day for men. Know how much alcohol is in a drink. In the [place] one drink equals one 12 oz bottle of beer (355 mL), one 5 oz glass of wine (148 mL), or one 1 oz glass of hard liquor (44 mL). Reading food labels  Check nutrition facts on food labels for the amount of fat per serving. Choose foods with less than 3 grams of fat per  serving. Shopping Choose nonfat and low-fat healthy foods. Look for the words "nonfat," "low-fat," or "fat-free." Avoid buying processed or prepackaged foods. Cooking Cook using low-fat methods, such as baking, broiling, grilling, or boiling. Cook with small amounts of healthy fats, such as olive oil, grapeseed oil, canola oil, avocado oil, or sunflower oil. What foods are recommended?  All fresh, frozen, or canned fruits and vegetables. Whole grains. Low-fat or nonfat (skim) milk and yogurt. Lean meat, skinless poultry, fish, eggs, and beans. Low-fat protein supplement powders or drinks. Spices and herbs. The items listed above may not be a complete list of foods and beverages you can eat and drink. Contact a dietitian for more information. What foods are not recommended? High-fat foods. These include baked goods, fast food, fatty cuts of meat, ice cream, french toast, sweet rolls, pizza, cheese bread, foods covered with butter, creamy sauces, or cheese. Fried foods. These include french fries, tempura, battered fish, breaded chicken, fried breads, and sweets. Foods that cause bloating and gas. The items listed above may not be a complete list of foods that you should avoid. Contact a dietitian for more information. Summary A low-fat diet can be helpful if you have a gallbladder condition, or before and after gallbladder surgery. Limit your fat intake to less than 30% of your total daily calories. This is about 60 g of fat if you eat 1,800 calories each day. Eat small, frequent meals throughout the day  instead of three large meals. This information is not intended to replace advice given to you by your health care provider. Make sure you discuss any questions you have with your health care provider. Document Revised: 11/12/2021 Document Reviewed: 11/12/2021 Elsevier Patient Education  2024 [employer].

## 2023-06-21 NOTE — Progress Notes (Signed)
06/21/2023  History of Present Illness: Amanda Holmes is a 72 y.o. female presenting for follow-up of gallstone pancreatitis.  She was admitted to the hospital on 06/04/2023 with gallstone pancreatitis.  MRCP showed diffuse pancreatic edema at the head and body with cholelithiasis but no choledocholithiasis.  No ERCP was needed.  Her pain continued to worsen on 6/25 and a repeat CT scan showed mild worsening of acute pancreatitis but no evidence of necrosis or pseudocyst.  She was eventually discharged on 06/08/2023.  The patient reports that since then she has been doing well without any worsening pain.  There is still some soreness that is residual but otherwise denies any nausea or vomiting.  Her appetite is not up to baseline yet  Past Medical History: Past Medical History:  Diagnosis Date   Atrial fibrillation (HCC)    Cancer (HCC)    skin ca squamous cell   CHF (congestive heart failure) (HCC)    GERD (gastroesophageal reflux disease)    Hypertension    Hypothyroidism    PONV (postoperative nausea and vomiting)      Past Surgical History: Past Surgical History:  Procedure Laterality Date   DILATION AND CURETTAGE OF UTERUS N/A 09/23/2016   Procedure: DILATATION AND CURETTAGE AND HYSTEROSCOPY;  Surgeon: Christeen Douglas, MD;  Location: [facility] ORS;  Service: Gynecology;  Laterality: N/A;   GANGLION CYST EXCISION     KNEE ARTHROPLASTY Left 08/23/2017   Procedure: COMPUTER ASSISTED TOTAL KNEE ARTHROPLASTY;  Surgeon: Donato Heinz, MD;  Location: [facility] ORS;  Service: Orthopedics;  Laterality: Left;   TUBAL LIGATION      Home Medications: Prior to Admission medications   Medication Sig Start Date End Date Taking? Authorizing Provider  acetaminophen (TYLENOL) 500 MG tablet Take 500-1,000 mg by mouth 2 (two) times daily. 2 tabs in the am, 1 tab at bedtime   Yes [provider]  DULoxetine (CYMBALTA) 30 MG capsule Take 30 mg by mouth daily.   Yes [provider]   furosemide (LASIX) 40 MG tablet Take 40 mg by mouth daily. 03/24/23  Yes [provider]  levothyroxine (SYNTHROID) 175 MCG tablet Take 175 mcg by mouth daily before breakfast.   Yes [provider]  loratadine (CLARITIN) 10 MG tablet Take 10 mg by mouth daily.   Yes [provider]  polyethylene glycol (MIRALAX / GLYCOLAX) 17 g packet Take 17 g by mouth daily. 06/09/23  Yes Arnetha Courser, MD  SOTALOL AF 80 MG TABS Take 80 mg by mouth daily. 04/21/23  Yes [provider]  spironolactone (ALDACTONE) 25 MG tablet Take 0.5 tablets (12.5 mg total) by mouth daily. 06/09/23  Yes Arnetha Courser, MD    Allergies: Allergies  Allergen Reactions   Amoxicillin Hives and Rash   Celebrex [Celecoxib] Anaphylaxis and Shortness Of Breath    Review of Systems: Review of Systems  Constitutional:  Negative for chills and fever.  HENT:  Negative for hearing loss.   Respiratory:  Negative for shortness of breath.   Cardiovascular:  Negative for chest pain.  Gastrointestinal:  Negative for abdominal pain, nausea and vomiting.  Genitourinary:  Negative for dysuria.  Musculoskeletal:  Negative for myalgias.  Skin:  Negative for rash.  Neurological:  Negative for dizziness.  Psychiatric/Behavioral:  Negative for depression.     Physical Exam BP (!) 148/83   Pulse 82   Temp 98.3 F (36.8 C) (Oral)   Ht 5\' 4"  (1.626 m)   Wt 229 lb 9.6 oz (104.1 kg)  SpO2 95%   BMI 39.41 kg/m  CONSTITUTIONAL: No acute distress HEENT:  Normocephalic, atraumatic, extraocular motion intact. NECK: Trachea is midline, no jugular venous distention. RESPIRATORY:  Lungs are clear, and breath sounds are equal bilaterally. Normal respiratory effort without pathologic use of accessory muscles. CARDIOVASCULAR: Heart is regular without murmurs, gallops, or rubs. GI: The abdomen is soft, nondistended, currently nontender to palpation.  There are some mild soreness in the epigastric area.   MUSCULOSKELETAL: Normal gait, no peripheral edema NEUROLOGIC:  Motor and sensation is grossly normal.  Cranial nerves are grossly intact. PSYCH:  Alert and oriented to person, place and time. Affect is normal.  Labs/Imaging: MRCP on 06/04/2023: IMPRESSION: 1. Diffuse pancreatic edema with head and body most affected. There is edema tracking in the anterior pararenal space and probable extension of edema into the transverse mesocolon. No focal fluid collection evident. Imaging features are compatible with acute pancreatitis. 2. Cholelithiasis with upper normal to borderline gallbladder wall thickness. No intrahepatic or extrahepatic biliary dilation. No evidence for choledocholithiasis.  CT abdomen/pelvis on 06/06/2023: IMPRESSION: Mild worsening of acute pancreatitis. No evidence of pancreatic necrosis, pseudocyst, or other complication.   Cholelithiasis. No radiographic evidence of cholecystitis or biliary ductal dilatation.   New tiny bilateral pleural effusions and dependent atelectasis, and trace pelvic ascites.   Assessment and Plan: This is a 72 y.o. female with recent episode of gallstone pancreatitis.  - Discussed with the patient that given the worsening pancreatitis while she was in hospital, we were not able to do her cholecystectomy during the same admission.  And with her degree of pancreatitis, it would be best right now to wait until the inflammation and scarring has subsided.  Discussed with patient that typically we would wait about 10 weeks for this to improve. - Following diet, we will recommend proceeding with cholecystectomy in order to prevent any further episodes of gallstone pancreatitis.  She is in agreement.  In the meantime, we will give her gallbladder diet information to try to decrease the risk of further issues. - Discussed with her the plan then for robotic assisted cholecystectomy and reviewed the surgery at length with her including the planned  incisions, risks of bleeding, infection, injury to surrounding structures, that this would be an outpatient procedure, the use of ICG to better evaluate the biliary anatomy, postoperative activity restrictions, pain control, and she is willing to proceed. - We will schedule the patient for surgery on 08/17/2023.  She will follow-up with me next month for H&P update.  I spent 40 minutes dedicated to the care of this patient on the date of this encounter to include pre-visit review of records, face-to-face time with the patient discussing diagnosis and management, and any post-visit coordination of care.   Howie Ill, MD [facility]

## 2023-06-22 ENCOUNTER — Other Ambulatory Visit: Payer: Self-pay

## 2023-06-22 ENCOUNTER — Telehealth: Payer: Self-pay | Admitting: Surgery

## 2023-06-22 DIAGNOSIS — K851 Biliary acute pancreatitis without necrosis or infection: Secondary | ICD-10-CM

## 2023-06-22 NOTE — Telephone Encounter (Signed)
Patient has been advised of Pre-Admission date/time, and Surgery date at [facility].  Surgery Date: 08/17/23 Preadmission Testing Date: 08/10/23 (phone 8a-1p)  Patient has been made aware to call [phone], between 1-3:00pm the day before surgery, to find out what time to arrive for surgery.

## 2023-07-01 IMAGING — MG MM DIGITAL SCREENING BILAT W/ TOMO AND CAD
8 series · 8 of 24 positions shown · non-contrast
Comparison: Previous exam(s).

CLINICAL DATA: Screening.

EXAM:
DIGITAL SCREENING BILATERAL MAMMOGRAM WITH TOMOSYNTHESIS AND CAD
TECHNIQUE: Bilateral screening digital craniocaudal and mediolateral oblique
mammograms were obtained. Bilateral screening digital breast
tomosynthesis was performed. The images were evaluated with
computer-aided detection.

[R CC synth-2D]
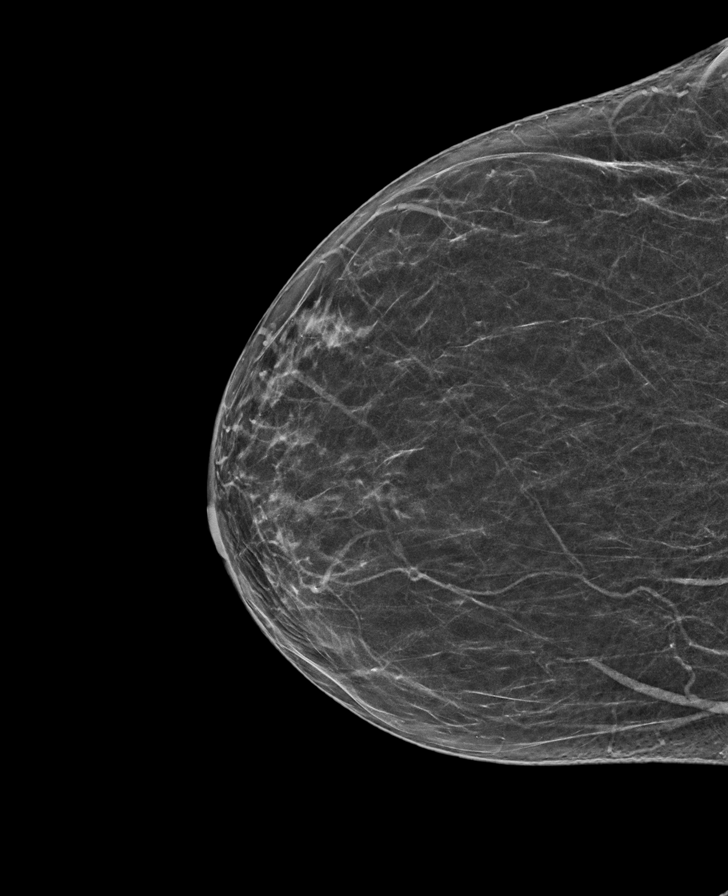

[L CC synth-2D]
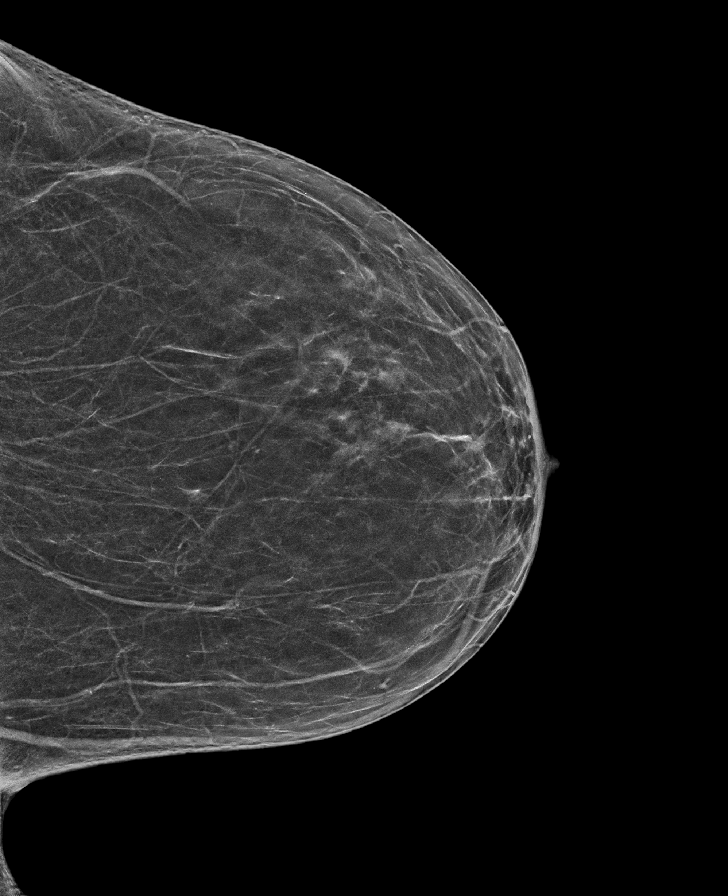

[L MLO synth-2D]
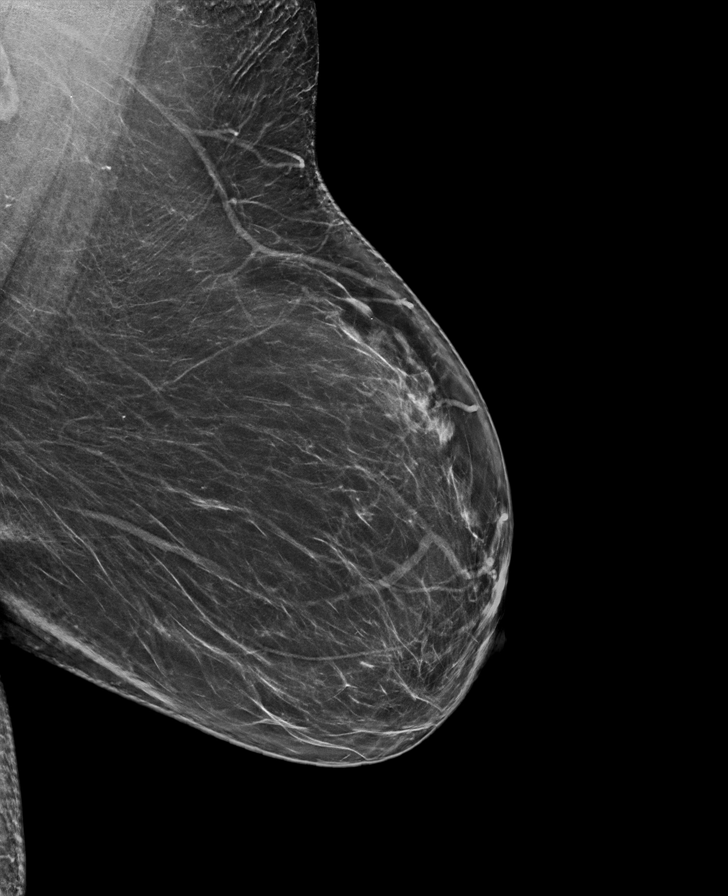

[R MLO synth-2D]
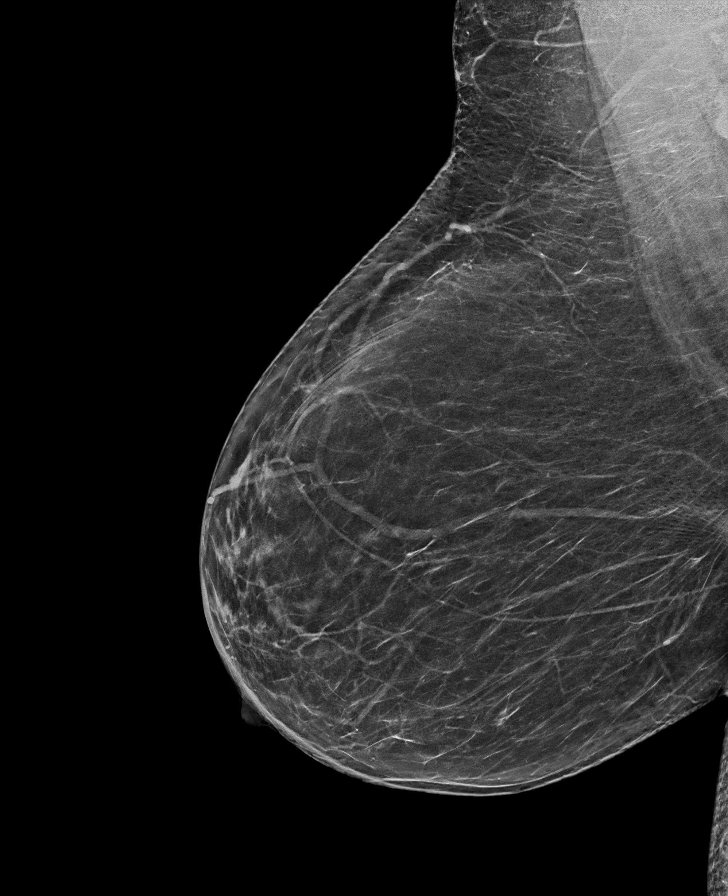

[L MLO tomo · tomo slice 41/81.0]
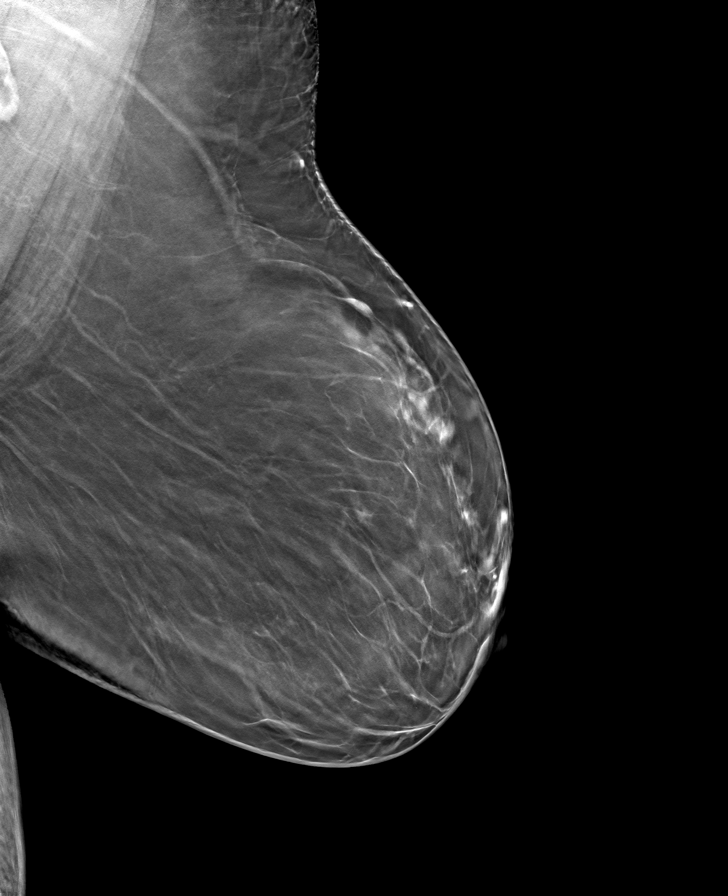

[L CC tomo · tomo slice 34/67.0]
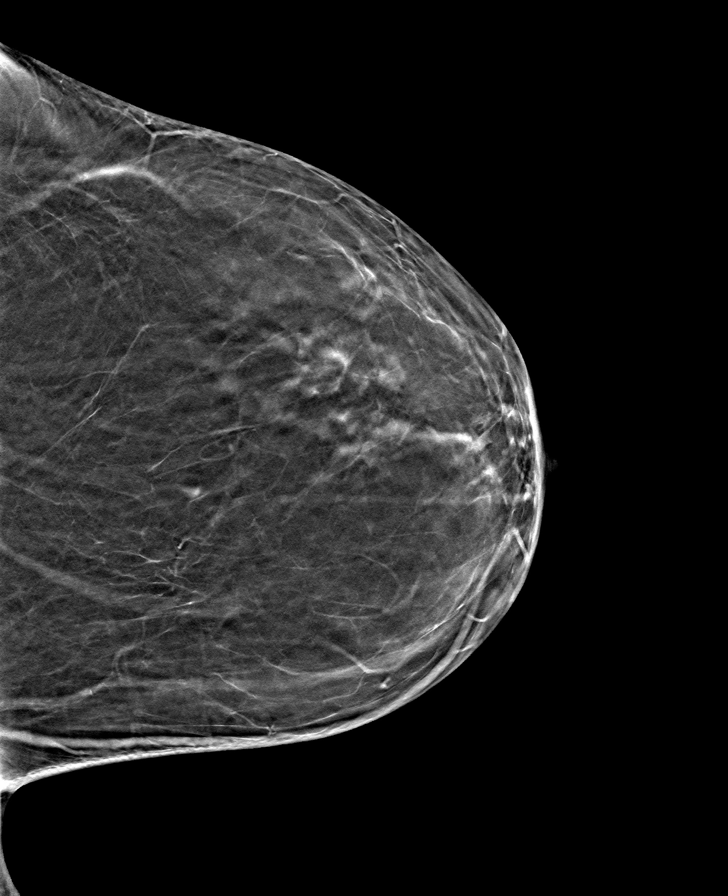

[R MLO tomo · tomo slice 40/79.0]
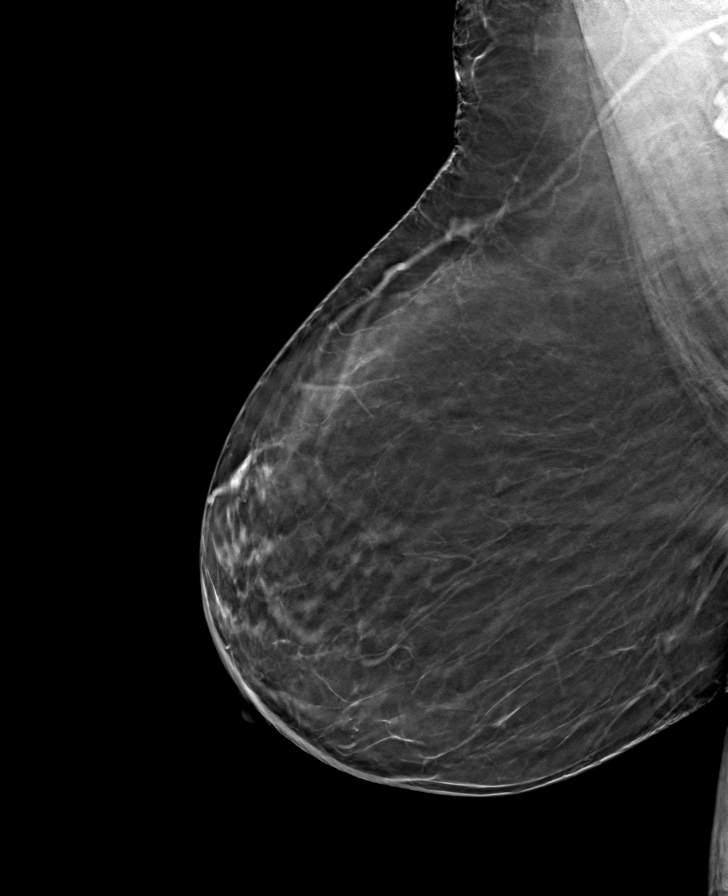

[R CC tomo · tomo slice 33/65.0]
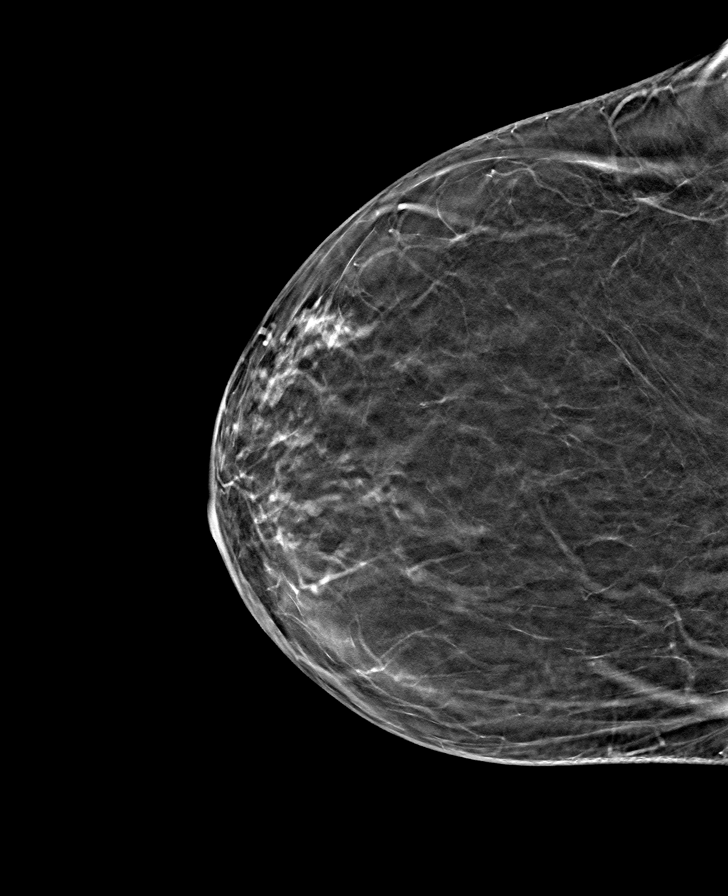

[8 of 24 positions shown; findings below may reference images not displayed]

ACR Breast Density Category b: There are scattered areas of
fibroglandular density.
FINDINGS: There are no findings suspicious for malignancy.
IMPRESSION: No mammographic evidence of malignancy. A result letter of this
screening mammogram will be mailed directly to the patient.

RECOMMENDATION:
Screening mammogram in one year. (Code:51-O-LD2)

BI-RADS CATEGORY  1: Negative.

## 2023-07-24 ENCOUNTER — Ambulatory Visit: Payer: Medicare HMO | Admitting: Surgery

## 2023-07-24 ENCOUNTER — Encounter: Payer: Self-pay | Admitting: Surgery

## 2023-07-24 VITALS — BP 142/76 | HR 56 | Temp 98.1°F | Ht 64.0 in | Wt 227.0 lb

## 2023-07-24 DIAGNOSIS — K851 Biliary acute pancreatitis without necrosis or infection: Secondary | ICD-10-CM

## 2023-07-24 NOTE — H&P (View-Only) (Signed)
 07/24/2023  History of Present Illness: Amanda Holmes is a 72 y.o. female presenting for follow up of gallstone pancreatitis.  She was admitted on 06/04/23 with gallstone pancreatitis but no cholecystitis.  Her pancreatitis worsened so surgery was deferred to outpatient setting.  She's currently scheduled for robotic assisted cholecystectomy on 08/17/23.  She presents today for H&P update.  She reports having an episode of pain in the epigastric area about two weeks ago after eating cornbread, but this resolved on its own and she did not need to go to the ER.  She reports some intermittent nausea with foods, but no particular/specific food.  Denies any worsening pain, worsening nausea, fevers, chills.  Of note, the patient recently had poison ivy and a RLE wound. She's on steroid taper and doxy.  Past Medical History: Past Medical History:  Diagnosis Date   Atrial fibrillation (HCC)    resolved   Cancer (HCC)    skin ca squamous cell   CHF (congestive heart failure) (HCC)    GERD (gastroesophageal reflux disease)    Hypertension    Hypothyroidism    PONV (postoperative nausea and vomiting)      Past Surgical History: Past Surgical History:  Procedure Laterality Date   DILATION AND CURETTAGE OF UTERUS N/A 09/23/2016   Procedure: DILATATION AND CURETTAGE AND HYSTEROSCOPY;  Surgeon: Christeen Douglas, MD;  Location: [facility] ORS;  Service: Gynecology;  Laterality: N/A;   GANGLION CYST EXCISION     KNEE ARTHROPLASTY Left 08/23/2017   Procedure: COMPUTER ASSISTED TOTAL KNEE ARTHROPLASTY;  Surgeon: Donato Heinz, MD;  Location: [facility] ORS;  Service: Orthopedics;  Laterality: Left;   TUBAL LIGATION      Home Medications: Prior to Admission medications   Medication Sig Start Date End Date Taking? Authorizing Provider  acetaminophen (TYLENOL) 500 MG tablet Take 500-1,000 mg by mouth 2 (two) times daily. 2 tabs in the am, 1 tab at bedtime   Yes [provider]  doxycycline (VIBRAMYCIN)  100 MG capsule Take 1 capsule by mouth 2 (two) times daily. 07/17/23 07/24/23 Yes [provider]  DULoxetine (CYMBALTA) 30 MG capsule Take 30 mg by mouth daily.   Yes [provider]  furosemide (LASIX) 40 MG tablet Take 40 mg by mouth daily. 03/24/23  Yes [provider]  levothyroxine (SYNTHROID) 175 MCG tablet Take 175 mcg by mouth daily before breakfast.   Yes [provider]  loratadine (CLARITIN) 10 MG tablet Take 10 mg by mouth daily.   Yes [provider]  polyethylene glycol (MIRALAX / GLYCOLAX) 17 g packet Take 17 g by mouth daily. 06/09/23  Yes Arnetha Courser, MD  predniSONE (DELTASONE) 10 MG tablet Take 10 mg by mouth 2 (two) times daily with a meal. 07/17/23  Yes [provider]  SOTALOL AF 80 MG TABS Take 80 mg by mouth daily. 04/21/23  Yes [provider]  spironolactone (ALDACTONE) 25 MG tablet Take 0.5 tablets (12.5 mg total) by mouth daily. 06/09/23  Yes Arnetha Courser, MD    Allergies: Allergies  Allergen Reactions   Amoxicillin Hives and Rash   Celebrex [Celecoxib] Anaphylaxis and Shortness Of Breath    Review of Systems: Review of Systems  Constitutional:  Negative for chills and fever.  Respiratory:  Negative for shortness of breath.   Cardiovascular:  Negative for chest pain.  Gastrointestinal:  Negative for abdominal pain, nausea and vomiting.    Physical Exam BP (!) 142/76   Pulse (!) 56   Temp 98.1 F (  36.7 C)   Ht 5\' 4"  (1.626 m)   Wt 227 lb (103 kg)   SpO2 98%   BMI 38.96 kg/m  CONSTITUTIONAL: No acute distress, well nourished. HEENT:  Normocephalic, atraumatic, extraocular motion intact. RESPIRATORY:  Lungs are clear, and breath sounds are equal bilaterally. Normal respiratory effort without pathologic use of accessory muscles. CARDIOVASCULAR: Heart is regular without murmurs, gallops, or rubs. GI: The abdomen is soft, non-distended, currently non-tender to palpation.  Negative Murphy's sign.   NEUROLOGIC:  Motor and sensation is grossly normal.  Cranial nerves are grossly intact. PSYCH:  Alert and oriented to person, place and time. Affect is normal.  Labs/Imaging: MRCP on 06/04/2023: IMPRESSION: 1. Diffuse pancreatic edema with head and body most affected. There is edema tracking in the anterior pararenal space and probable extension of edema into the transverse mesocolon. No focal fluid collection evident. Imaging features are compatible with acute pancreatitis. 2. Cholelithiasis with upper normal to borderline gallbladder wall thickness. No intrahepatic or extrahepatic biliary dilation. No evidence for choledocholithiasis.   CT abdomen/pelvis on 06/06/2023: IMPRESSION: Mild worsening of acute pancreatitis. No evidence of pancreatic necrosis, pseudocyst, or other complication.   Cholelithiasis. No radiographic evidence of cholecystitis or biliary ductal dilatation.   New tiny bilateral pleural effusions and dependent atelectasis, and trace pelvic ascites.   Assessment and Plan: This is a 72 y.o. female with history of gallstone pancreatitis.  --The patient had an episode of pain in the epigastric area after eating cornbread about two weeks ago. This resolved on its own.  May be related to greasy/fat causing the pain.  Currently asymptomatic.  Patient is scheduled for robotic cholecystectomy on 08/17/23 and I think we can continue with this timeline/plan. --Reviewed with her again the surgery at length including the planned incisions, the risks of bleeding, infection, injury to surrounding structures, the use of ICG to better evaluate the biliary anatomy, post-operative activity restrictions, pain control, and she's willing to proceed. --All of her questions have been answered.  I spent 20 minutes dedicated to the care of this patient on the date of this encounter to include pre-visit review of records, face-to-face time with the patient discussing diagnosis and  management, and any post-visit coordination of care.   Howie Ill, MD [facility]

## 2023-07-24 NOTE — Patient Instructions (Addendum)
You have requested to have your gallbladder removed. This will be done at [facility] with Dr. Aleen Campi on 08/17/23.  You will most likely be out of work 1-2 weeks for this surgery.  If you have FMLA or disability paperwork that needs filled out you may drop this off at our office or this can be faxed to [phone].  You will return after your post-op appointment with a lifting restriction for approximately 4 more weeks.  You will be able to eat anything you would like to following surgery. But, start by eating a bland diet and advance this as tolerated. The Gallbladder diet is below, please go as closely by this diet as possible prior to surgery to avoid any further attacks.  If you have any questions, please call our office.  Laparoscopic Cholecystectomy Laparoscopic cholecystectomy is surgery to remove the gallbladder. The gallbladder is located in the upper right part of the abdomen, behind the liver. It is a storage sac for bile, which is produced in the liver. Bile aids in the digestion and absorption of fats. Cholecystectomy is often done for inflammation of the gallbladder (cholecystitis). This condition is usually caused by a buildup of gallstones (cholelithiasis) in the gallbladder. Gallstones can block the flow of bile, and that can result in inflammation and pain. In severe cases, emergency surgery may be required. If emergency surgery is not required, you will have time to prepare for the procedure. Laparoscopic surgery is an alternative to open surgery. Laparoscopic surgery has a shorter recovery time. Your common bile duct may also need to be examined during the procedure. If stones are found in the common bile duct, they may be removed. LET [facility] CARE PROVIDER KNOW ABOUT: Any allergies you have. All medicines you are taking, including vitamins, herbs, eye drops, creams, and over-the-counter medicines. Previous problems you or members of your family have had with the use  of anesthetics. Any blood disorders you have. Previous surgeries you have had.  Any medical conditions you have. RISKS AND COMPLICATIONS Generally, this is a safe procedure. However, problems may occur, including: Infection. Bleeding. Allergic reactions to medicines. Damage to other structures or organs. A stone remaining in the common bile duct. A bile leak from the cyst duct that is clipped when your gallbladder is removed. The need to convert to open surgery, which requires a larger incision in the abdomen. This may be necessary if your surgeon thinks that it is not safe to continue with a laparoscopic procedure. BEFORE THE PROCEDURE Ask your health care provider about: Changing or stopping your regular medicines. This is especially important if you are taking diabetes medicines or blood thinners. Taking medicines such as aspirin and ibuprofen. These medicines can thin your blood. Do not take these medicines before your procedure if your health care provider instructs you not to. Follow instructions from your health care provider about eating or drinking restrictions. Let your health care provider know if you develop a cold or an infection before surgery. Plan to have someone take you home after the procedure. Ask your health care provider how your surgical site will be marked or identified. You may be given antibiotic medicine to help prevent infection. PROCEDURE To reduce your risk of infection: Your health care team will wash or sanitize their hands. Your skin will be washed with soap. An IV tube may be inserted into one of your veins. You will be given a medicine to make you fall asleep (general anesthetic). A breathing tube will  be placed in your mouth. The surgeon will make several small cuts (incisions) in your abdomen. A thin, lighted tube (laparoscope) that has a tiny camera on the end will be inserted through one of the small incisions. The camera on the laparoscope will  send a picture to a TV screen (monitor) in the operating room. This will give the surgeon a good view inside your abdomen. A gas will be pumped into your abdomen. This will expand your abdomen to give the surgeon more room to perform the surgery. Other tools that are needed for the procedure will be inserted through the other incisions. The gallbladder will be removed through one of the incisions. After your gallbladder has been removed, the incisions will be closed with stitches (sutures), staples, or skin glue. Your incisions may be covered with a bandage (dressing). The procedure may vary among health care providers and hospitals. AFTER THE PROCEDURE Your blood pressure, heart rate, breathing rate, and blood oxygen level will be monitored often until the medicines you were given have worn off. You will be given medicines as needed to control your pain.   This information is not intended to replace advice given to you by your health care provider. Make sure you discuss any questions you have with your health care provider.   Document Released: 11/28/2005 Document Revised: 08/19/2015 Document Reviewed: 07/10/2013 Elsevier Interactive Patient Education 2016 [employer]   Low-Fat Diet for Gallbladder Conditions A low-fat diet can be helpful if you have pancreatitis or a gallbladder condition. With these conditions, your pancreas and gallbladder have trouble digesting fats. A healthy eating plan with less fat will help rest your pancreas and gallbladder and reduce your symptoms. WHAT DO I NEED TO KNOW ABOUT THIS DIET? Eat a low-fat diet. Reduce your fat intake to less than 20-30% of your total daily calories. This is less than 50-60 g of fat per day. Remember that you need some fat in your diet. Ask your dietician what your daily goal should be. Choose nonfat and low-fat healthy foods. Look for the words "nonfat," "low fat," or "fat free." As a guide, look on the label and choose foods with  less than 3 g of fat per serving. Eat only one serving. Avoid alcohol. Do not smoke. If you need help quitting, talk with your health care provider. Eat small frequent meals instead of three large heavy meals. WHAT FOODS CAN I EAT? Grains Include healthy grains and starches such as potatoes, wheat bread, fiber-rich cereal, and brown rice. Choose whole grain options whenever possible. In adults, whole grains should account for 45-65% of your daily calories.  Fruits and Vegetables Eat plenty of fruits and vegetables. Fresh fruits and vegetables add fiber to your diet. Meats and Other Protein Sources Eat lean meat such as chicken and pork. Trim any fat off of meat before cooking it. Eggs, fish, and beans are other sources of protein. In adults, these foods should account for 10-35% of your daily calories. Dairy Choose low-fat milk and dairy options. Dairy includes fat and protein, as well as calcium.  Fats and Oils Limit high-fat foods such as fried foods, sweets, baked goods, sugary drinks.  Other Creamy sauces and condiments, such as mayonnaise, can add extra fat. Think about whether or not you need to use them, or use smaller amounts or low fat options. WHAT FOODS ARE NOT RECOMMENDED? High fat foods, such as: [employer]. Ice cream. [place] toast. Sweet rolls. Pizza. Cheese bread. Foods covered with batter, butter,  creamy sauces, or cheese. Fried foods. Sugary drinks and desserts. Foods that cause gas or bloating   This information is not intended to replace advice given to you by your health care provider. Make sure you discuss any questions you have with your health care provider.   Document Released: 12/03/2013 Document Reviewed: 12/03/2013 Elsevier Interactive Patient Education [employer]

## 2023-07-24 NOTE — Progress Notes (Signed)
07/24/2023  History of Present Illness: Amanda Holmes is a 72 y.o. female presenting for follow up of gallstone pancreatitis.  She was admitted on 06/04/23 with gallstone pancreatitis but no cholecystitis.  Her pancreatitis worsened so surgery was deferred to outpatient setting.  She's currently scheduled for robotic assisted cholecystectomy on 08/17/23.  She presents today for H&P update.  She reports having an episode of pain in the epigastric area about two weeks ago after eating cornbread, but this resolved on its own and she did not need to go to the ER.  She reports some intermittent nausea with foods, but no particular/specific food.  Denies any worsening pain, worsening nausea, fevers, chills.  Of note, the patient recently had poison ivy and a RLE wound. She's on steroid taper and doxy.  Past Medical History: Past Medical History:  Diagnosis Date   Atrial fibrillation (HCC)    resolved   Cancer (HCC)    skin ca squamous cell   CHF (congestive heart failure) (HCC)    GERD (gastroesophageal reflux disease)    Hypertension    Hypothyroidism    PONV (postoperative nausea and vomiting)      Past Surgical History: Past Surgical History:  Procedure Laterality Date   DILATION AND CURETTAGE OF UTERUS N/A 09/23/2016   Procedure: DILATATION AND CURETTAGE AND HYSTEROSCOPY;  Surgeon: Christeen Douglas, MD;  Location: [facility] ORS;  Service: Gynecology;  Laterality: N/A;   GANGLION CYST EXCISION     KNEE ARTHROPLASTY Left 08/23/2017   Procedure: COMPUTER ASSISTED TOTAL KNEE ARTHROPLASTY;  Surgeon: Donato Heinz, MD;  Location: [facility] ORS;  Service: Orthopedics;  Laterality: Left;   TUBAL LIGATION      Home Medications: Prior to Admission medications   Medication Sig Start Date End Date Taking? Authorizing Provider  acetaminophen (TYLENOL) 500 MG tablet Take 500-1,000 mg by mouth 2 (two) times daily. 2 tabs in the am, 1 tab at bedtime   Yes [provider]  doxycycline (VIBRAMYCIN)  100 MG capsule Take 1 capsule by mouth 2 (two) times daily. 07/17/23 07/24/23 Yes [provider]  DULoxetine (CYMBALTA) 30 MG capsule Take 30 mg by mouth daily.   Yes [provider]  furosemide (LASIX) 40 MG tablet Take 40 mg by mouth daily. 03/24/23  Yes [provider]  levothyroxine (SYNTHROID) 175 MCG tablet Take 175 mcg by mouth daily before breakfast.   Yes [provider]  loratadine (CLARITIN) 10 MG tablet Take 10 mg by mouth daily.   Yes [provider]  polyethylene glycol (MIRALAX / GLYCOLAX) 17 g packet Take 17 g by mouth daily. 06/09/23  Yes Arnetha Courser, MD  predniSONE (DELTASONE) 10 MG tablet Take 10 mg by mouth 2 (two) times daily with a meal. 07/17/23  Yes [provider]  SOTALOL AF 80 MG TABS Take 80 mg by mouth daily. 04/21/23  Yes [provider]  spironolactone (ALDACTONE) 25 MG tablet Take 0.5 tablets (12.5 mg total) by mouth daily. 06/09/23  Yes Arnetha Courser, MD    Allergies: Allergies  Allergen Reactions   Amoxicillin Hives and Rash   Celebrex [Celecoxib] Anaphylaxis and Shortness Of Breath    Review of Systems: Review of Systems  Constitutional:  Negative for chills and fever.  Respiratory:  Negative for shortness of breath.   Cardiovascular:  Negative for chest pain.  Gastrointestinal:  Negative for abdominal pain, nausea and vomiting.    Physical Exam BP (!) 142/76   Pulse (!) 56   Temp 98.1 F (  36.7 C)   Ht 5\' 4"  (1.626 m)   Wt 227 lb (103 kg)   SpO2 98%   BMI 38.96 kg/m  CONSTITUTIONAL: No acute distress, well nourished. HEENT:  Normocephalic, atraumatic, extraocular motion intact. RESPIRATORY:  Lungs are clear, and breath sounds are equal bilaterally. Normal respiratory effort without pathologic use of accessory muscles. CARDIOVASCULAR: Heart is regular without murmurs, gallops, or rubs. GI: The abdomen is soft, non-distended, currently non-tender to palpation.  Negative Murphy's sign.   NEUROLOGIC:  Motor and sensation is grossly normal.  Cranial nerves are grossly intact. PSYCH:  Alert and oriented to person, place and time. Affect is normal.  Labs/Imaging: MRCP on 06/04/2023: IMPRESSION: 1. Diffuse pancreatic edema with head and body most affected. There is edema tracking in the anterior pararenal space and probable extension of edema into the transverse mesocolon. No focal fluid collection evident. Imaging features are compatible with acute pancreatitis. 2. Cholelithiasis with upper normal to borderline gallbladder wall thickness. No intrahepatic or extrahepatic biliary dilation. No evidence for choledocholithiasis.   CT abdomen/pelvis on 06/06/2023: IMPRESSION: Mild worsening of acute pancreatitis. No evidence of pancreatic necrosis, pseudocyst, or other complication.   Cholelithiasis. No radiographic evidence of cholecystitis or biliary ductal dilatation.   New tiny bilateral pleural effusions and dependent atelectasis, and trace pelvic ascites.   Assessment and Plan: This is a 72 y.o. female with history of gallstone pancreatitis.  --The patient had an episode of pain in the epigastric area after eating cornbread about two weeks ago. This resolved on its own.  May be related to greasy/fat causing the pain.  Currently asymptomatic.  Patient is scheduled for robotic cholecystectomy on 08/17/23 and I think we can continue with this timeline/plan. --Reviewed with her again the surgery at length including the planned incisions, the risks of bleeding, infection, injury to surrounding structures, the use of ICG to better evaluate the biliary anatomy, post-operative activity restrictions, pain control, and she's willing to proceed. --All of her questions have been answered.  I spent 20 minutes dedicated to the care of this patient on the date of this encounter to include pre-visit review of records, face-to-face time with the patient discussing diagnosis and  management, and any post-visit coordination of care.   Howie Ill, MD [facility]

## 2023-08-10 ENCOUNTER — Inpatient Hospital Stay: Admission: RE | Admit: 2023-08-10 | Payer: Medicare HMO | Source: Ambulatory Visit

## 2023-08-10 ENCOUNTER — Encounter: Payer: Self-pay | Admitting: Surgery

## 2023-08-10 ENCOUNTER — Encounter
Admission: RE | Admit: 2023-08-10 | Discharge: 2023-08-10 | Disposition: A | Discharge status: Home / Self Care | Payer: Medicare HMO | Source: Ambulatory Visit | Attending: Surgery | Admitting: Surgery

## 2023-08-10 ENCOUNTER — Other Ambulatory Visit: Payer: Self-pay

## 2023-08-10 DIAGNOSIS — E119 Type 2 diabetes mellitus without complications: Secondary | ICD-10-CM

## 2023-08-10 DIAGNOSIS — Z79899 Other long term (current) drug therapy: Secondary | ICD-10-CM

## 2023-08-10 DIAGNOSIS — E876 Hypokalemia: Secondary | ICD-10-CM

## 2023-08-10 DIAGNOSIS — Z01812 Encounter for preprocedural laboratory examination: Secondary | ICD-10-CM

## 2023-08-10 HISTORY — DX: Paroxysmal atrial fibrillation: I48.0

## 2023-08-10 HISTORY — DX: Other forms of dyspnea: R06.09

## 2023-08-10 HISTORY — DX: Atherosclerosis of aorta: I70.0

## 2023-08-10 HISTORY — DX: Type 2 diabetes mellitus without complications: E11.9

## 2023-08-10 HISTORY — DX: Cardiac murmur, unspecified: R01.1

## 2023-08-10 HISTORY — DX: Localized edema: R60.0

## 2023-08-10 HISTORY — DX: Squamous cell carcinoma of skin, unspecified: C44.92

## 2023-08-10 HISTORY — DX: Unspecified osteoarthritis, unspecified site: M19.90

## 2023-08-10 NOTE — Progress Notes (Signed)
Perioperative / Anesthesia Services  Pre-Admission Testing Clinical Review / Pre-Operative Anesthesia Consult  Date: 08/15/23  Patient Demographics:  Name: Amanda Holmes DOB:   24-Nov-1951 MRN:   [id number]  Planned Surgical Procedure(s):    Case: [id number] Date/Time: 08/17/23 0715   Procedures:      XI ROBOTIC ASSISTED LAPAROSCOPIC CHOLECYSTECTOMY     INDOCYANINE GREEN FLUORESCENCE IMAGING (ICG)   Anesthesia type: General   Pre-op diagnosis: gallstone pancreatitis K85.10   Location: [facility] OR ROOM 04 / [facility]   Surgeons: Henrene Dodge, MD     NOTE: Available PAT nursing documentation and vital signs have been reviewed. Clinical nursing staff has updated patient's PMH/PSHx, current medication list, and drug allergies/intolerances to ensure comprehensive history available to assist in medical decision making as it pertains to the aforementioned surgical procedure and anticipated anesthetic course. Extensive review of available clinical information personally performed. [place] PMH and PSHx updated with any diagnoses/procedures that  may have been inadvertently omitted during her intake with the pre-admission testing department's nursing staff.  Clinical Discussion:  Amanda Holmes is a 72 y.o. female who is submitted for pre-surgical anesthesia review and clearance prior to her undergoing the above procedure. Patient has never been a smoke. Pertinent PMH includes: PAF, HFpEF, aortic atherosclerosis, cardiac murmur, peripheral edema, HTN, T2DM, hypothyroidism, DOE, GERD (no daily Tx), gallstone pancreatitis, OA, chronic pain syndrome.  Patient is followed by cardiology Darrold Junker, MD). She was last seen in the cardiology clinic on 04/24/2023; notes reviewed. At the time of her clinic visit, patient doing well overall from a cardiovascular perspective.  Patient has chronic exertional dyspnea that is reported to be stable and at baseline.  Additionally, patient  having brief episodes of nonexertional palpitations.  Patient denied any chest pain, PND, orthopnea, significant peripheral edema, weakness, fatigue, vertiginous symptoms, or presyncope/syncope. Patient with a past medical history significant for cardiovascular diagnoses. Documented physical exam was grossly benign, providing no evidence of acute exacerbation and/or decompensation of the patient's known cardiovascular conditions.  Most recent TTE was performed on 10/21/2019 revealing a normal left ventricular systolic function with an EF of 60-65%.  There was mild LVH.  No significant regional wall motion abnormalities noted.  Left ventricular diastolic Doppler parameters indeterminate.  Right ventricle was mildly enlarged with normal systolic function.  There was mild biatrial enlargement.  Trace mitral and tricuspid valve regurgitation noted.  All transvalvular gradients were noted to be normal providing no evidence suggestive of valvular stenosis.  Aorta normal in size with no evidence of aneurysmal dilatation.  Long-term cardiac event monitor study performed on 03/28/2022 revealed a predominant underlying sinus rhythm with a mean rate of 79 bpm; range 57-136 bpm.  There was occasional atrial and ventricular ectopy noted.  Additionally, there were occasional atrial runs observed with the longest lasting 19 beats.  There was no evidence of atrial fibrillation.  There were no prolonged pauses.  Most recent myocardial perfusion imaging study performed on 07/13/2022 revealed a normal left ventricular systolic function with an EF of 54%.  There were no regional wall motion abnormalities.  There was no evidence of stress-induced myocardial ischemia or arrhythmia; no scintigraphic evidence of scar.  Study determined to be normal and low risk.  Patient with an atrial fibrillation diagnosis; CHA2DS2-VASc Score = 6 (age, sex, CHF, HTN, vascular disease history, T2DM) . Her rate and rhythm are currently being  maintained on oral sotalol.  Patient is not currently on daily oral anticoagulation therapy citing the  need to take NSAIDs for her knee pain.  Additionally, there is financial constraint related to the use of the prescribed rivaroxaban therapy.  Per patient, she only had a single episode of atrial fibrillation in the setting of hypothyroidism, though she self discontinued therapy.  Blood pressure reasonably controlled at 144/74 mmHg on currently prescribed diuretic (furosemide + spironolactone) and beta-blocker (sotalol) therapies.  Of note, patient self discontinued her prescribed ARB (losartan) dose due to good blood pressure control at home.  Patient reported that she feels much better off of this medication.  Patient currently not taking any type of lipid-lowering therapy for ASCVD prevention.  T2DM well-controlled on currently prescribed regimen; last HgbA1c at the time of her cardiology visit was 6.7% when checked on 11/14/2022.  Of note, since patient was last seen by cardiology, A1c has been rechecked with further elevation to 7.1% on 05/22/2023.  Patient active per baseline.  Patient continues to work cleaning houses.  She does not have a formal exercise regimen.  With that being said, patient able to complete all ADLs/IADLs independently without significant cardiovascular limitation.  Per the [facility], patient able to exceed 4 METS of physical activity without experiencing any significant angina/anginal equivalent symptoms.  No changes were made to her medication regimen.  Patient to follow-up with outpatient cardiology in 6 months or sooner if needed.   Amanda Holmes is scheduled for an XI ROBOTIC ASSISTED LAPAROSCOPIC CHOLECYSTECTOMY on 08/17/2023 with Dr. Ernesto Rutherford, MD.  Given patient's past medical history significant for cardiovascular diagnoses, presurgical cardiac clearance was sought by the PAT team. Per cardiology, "this patient is optimized for surgery and may proceed with the planned  procedural course with a LOW risk of significant perioperative cardiovascular complications".    In review of her medication reconciliation, the patient is not noted to be taking any type of anticoagulation or antiplatelet therapies that would need to be held during her perioperative course.  Patient reports previous perioperative complications with anesthesia in the past. Patient has a PMH (+) for PONV. Symptoms and history of PONV will be discussed with patient by anesthesia team on the day of her procedure. Interventions will be ordered as deemed necessary based on patient's individual care needs as determined by anesthesiologist. In review of the available records, it is noted that patient underwent a neuraxial anesthetic course here at [facility] (ASA III) in 08/2017 without documented complications.      08/10/2023    9:30 AM 07/24/2023    8:46 AM 06/21/2023   10:10 AM  Vitals with BMI  Height 5\' 4"  5\' 4"  5\' 4"   Weight 228 lbs 227 lbs 229 lbs 10 oz  BMI 39.12 38.95 39.39  Systolic  142 148  Diastolic  76 83  Pulse  56 82    Providers/Specialists:   NOTE: Primary physician provider listed below. Patient may have been seen by APP or partner within same practice.   PROVIDER ROLE / SPECIALTY LAST Eligha Bridegroom, MD General Surgery (Surgeon) 07/24/2023  Lynnea Ferrier, MD Primary Care Provider 06/13/2023  Marcina Millard, MD Cardiology 04/24/2023   Allergies:  Amoxicillin and Celebrex [celecoxib]  Current Home Medications:   No current facility-administered medications for this encounter.    acetaminophen (TYLENOL) 500 MG tablet   docusate sodium (COLACE) 100 MG capsule   DULoxetine (CYMBALTA) 30 MG capsule   furosemide (LASIX) 40 MG tablet   levothyroxine (SYNTHROID) 175 MCG tablet   loratadine (CLARITIN) 10 MG  tablet   SOTALOL AF 80 MG TABS   spironolactone (ALDACTONE) 25 MG tablet   History:   Past Medical History:   Diagnosis Date   (HFpEF) heart failure with preserved ejection fraction (HCC) 09/21/2015   a.) TTE 09/21/2015: EF 50%, focal septal HK, mild LAE, triv AR/PR, mild TR, G1DD; b.) TTE 10/21/2019: EF 60-65%, no RWMAs, mild BAE, triv MR/TR   Aortic atherosclerosis (HCC)    Arthritis    Cardiac murmur    Chronic pain syndrome    DOE (dyspnea on exertion)    Gallstone pancreatitis    GERD (gastroesophageal reflux disease)    Hypertension    Hypothyroidism    PAF (paroxysmal atrial fibrillation) (HCC)    a.) CHA2DS2-VASc: 6 (age, sex, CHF, HTN, vascular disease history, T2DM); b.) rate/rhythm maintained on oral sotolol; not on chronic OAC therapy   Peripheral edema    PONV (postoperative nausea and vomiting)    Squamous cell skin cancer    T2DM (type 2 diabetes mellitus) (HCC)    Past Surgical History:  Procedure Laterality Date   COLONOSCOPY     DILATION AND CURETTAGE OF UTERUS N/A 09/23/2016   Procedure: DILATATION AND CURETTAGE AND HYSTEROSCOPY;  Surgeon: Christeen Douglas, MD;  Location: [facility] ORS;  Service: Gynecology;  Laterality: N/A;   GANGLION CYST EXCISION     KNEE ARTHROPLASTY Left 08/23/2017   Procedure: COMPUTER ASSISTED TOTAL KNEE ARTHROPLASTY;  Surgeon: Donato Heinz, MD;  Location: [facility] ORS;  Service: Orthopedics;  Laterality: Left;   TUBAL LIGATION     Family History  Problem Relation Age of Onset   Breast cancer Sister 93   Social History   Tobacco Use   Smoking status: Never    Passive exposure: Never   Smokeless tobacco: Never  Vaping Use   Vaping status: Never Used  Substance Use Topics   Alcohol use: No   Drug use: No    Pertinent Clinical Results:  LABS:   Lab Results  Component Value Date   WBC 12.5 (H) 06/07/2023   HGB 11.1 (L) 06/07/2023   HCT 35.2 (L) 06/07/2023   MCV 95.7 06/07/2023   PLT 176 06/07/2023   Lab Results  Component Value Date   NA 138 08/11/2023   K 3.4 (L) 08/11/2023   CO2 27 08/11/2023   GLUCOSE 167 (H) 08/11/2023    BUN 14 08/11/2023   CREATININE 0.72 08/11/2023   CALCIUM 9.0 08/11/2023   GFRNONAA >60 08/11/2023    ECG: Date: 06/04/2023 Time ECG obtained: 1616 PM Rate: 81 bpm Rhythm: normal sinus Axis (leads I and aVF): Normal Intervals: PR 172 ms. QRS 84 ms. QTc 453 ms. ST segment and T wave changes: No evidence of acute ST segment elevation or depression.   Comparison: Similar to previous tracing obtained on 07/23/2022   IMAGING / PROCEDURES: CT ABDOMEN PELVIS W CONTRAST performed on 06/06/2023 Mild worsening of acute pancreatitis. No evidence of pancreatic necrosis, pseudocyst, or other complication. Cholelithiasis. No radiographic evidence of cholecystitis or biliary ductal dilatation. New tiny bilateral pleural effusions and dependent atelectasis, and trace pelvic ascites.  MYOCARDIAL PERFUSION IMAGING STUDY (LEXISCAN) performed on 07/13/2022 Normal left ventricular systolic function with a normal LVEF of 54% Normal myocardial thickening and wall motion Left ventricular cavity size normal SPECT images demonstrate homogenous tracer distribution throughout the myocardium No evidence of stress-induced myocardial ischemia or arrhythmia Normal low risk study  CT ANGIO CHEST PE W/CM &/OR WO CM performed on 06/04/2023 No CT evidence for acute pulmonary embolus.  Subtle edema/stranding anterior to the pancreas and extending into the transverse mesocolon. Imaging features are concerning for acute pancreatitis. Correlation with amylase/lipase may prove helpful. Subtle irregularity of liver contour raises the question of cirrhosis. Cholelithiasis. Aortic atherosclerosis   TRANSTHORACIC ECHOCARDIOGRAM performed on 10/21/2019 Left ventricular ejection fraction, by visual estimation, is 60 to 65%. The left ventricle has normal function. There is mildly increased left ventricular hypertrophy.  Left ventricular diastolic parameters are indeterminate.  Global right ventricle has normal systolic  function.The right ventricular size is mildly enlarged. No increase in right ventricular wall thickness.  Left atrial size was mildly dilated.  Right atrial size was mildly dilated.  The mitral valve is grossly normal. Trace mitral valve regurgitation. No evidence of mitral stenosis.  The tricuspid valve is normal in structure. Tricuspid valve regurgitation is trivial.  The aortic valve is normal in structure. Aortic valve regurgitation is not visualized. No evidence of aortic valve sclerosis or stenosis.  The pulmonic valve was normal in structure. Pulmonic valve regurgitation is not visualized.  Normal pulmonary artery systolic pressure.  The inferior vena cava is normal in size with greater than 50% respiratory variability, suggesting right atrial pressure of 3 mmHg.   Impression and Plan:  Amanda Holmes has been referred for pre-anesthesia review and clearance prior to her undergoing the planned anesthetic and procedural courses. Available labs, pertinent testing, and imaging results were personally reviewed by me in preparation for upcoming operative/procedural course. [facility] medical record has been updated following extensive record review and patient interview with PAT staff.   This patient has been appropriately cleared by cardiology with an overall LOW risk of experiencing significant perioperative cardiovascular complications. Based on clinical review performed today (08/15/23), barring any significant acute changes in the patient's overall condition, it is anticipated that she will be able to proceed with the planned surgical intervention. Any acute changes in clinical condition may necessitate her procedure being postponed and/or cancelled. Patient will meet with anesthesia team (MD and/or CRNA) on the day of her procedure for preoperative evaluation/assessment. Questions regarding anesthetic course will be fielded at that time.   Pre-surgical instructions were reviewed with the  patient during her PAT appointment, and questions were fielded to satisfaction by PAT clinical staff. She has been instructed on which medications that she will need to hold prior to surgery, as well as the ones that have been deemed safe/appropriate to take on the day of her procedure. As part of the general education provided by PAT, patient made aware both verbally and in writing, that she would need to abstain from the use of any illegal substances during her perioperative course.  She was advised that failure to follow the provided instructions could necessitate case cancellation or result in serious perioperative complications up to and including death. Patient encouraged to contact PAT and/or her surgeon's office to discuss any questions or concerns that may arise prior to surgery; verbalized understanding.   Quentin Mulling, MSN, APRN, FNP-C, CEN [facility]  Peri-operative Services Nurse Practitioner Phone: [phone] Fax: [phone] 08/15/23 9:25 AM  NOTE: This note has been prepared using Dragon dictation software. Despite my best ability to proofread, there is always the potential that unintentional transcriptional errors may still occur from this process.

## 2023-08-10 NOTE — Patient Instructions (Addendum)
Your procedure is scheduled on: Thursday 08/17/23 To find out your arrival time, please call [phone] between 1PM - 3PM on:   Wednesday 08/16/23 Report to the Registration Desk on the 1st floor of the [facility]. FREE Valet parking is available.  If your arrival time is 6:00 am, do not arrive before that time as the [facility] entrance doors do not open until 6:00 am.  REMEMBER: Instructions that are not followed completely may result in serious medical risk, up to and including death; or upon the discretion of your surgeon and anesthesiologist your surgery may need to be rescheduled.  Do not eat food after midnight the night before surgery.  No gum chewing or hard candies.  You may however, drink CLEAR liquids up to 2 hours before you are scheduled to arrive for your surgery. Do not drink anything within 2 hours of your scheduled arrival time.  Type 1 and Type 2 diabetics should only drink water.  One week prior to surgery: Stop Anti-inflammatories (NSAIDS) such as Advil, Aleve, Ibuprofen, Motrin, Naproxen, Naprosyn and Aspirin based products such as Excedrin, Goody's Powder, BC Powder. You may however, continue to take Tylenol if needed for pain up until the day of surgery.  Stop ANY OVER THE COUNTER supplements until after surgery.  Continue all prescribed medications.  TAKE ONLY THESE MEDICATIONS THE MORNING OF SURGERY WITH A SIP OF WATER:  levothyroxine (SYNTHROID) 175 MCG tablet  loratadine (CLARITIN) 10 MG tablet  SOTALOL AF 80 MG TABS   No Alcohol for 24 hours before or after surgery.  No Smoking including e-cigarettes for 24 hours before surgery.  No chewable tobacco products for at least 6 hours before surgery.  No nicotine patches on the day of surgery.  Do not use any "recreational" drugs for at least a week (preferably 2 weeks) before your surgery.  Please be advised that the combination of cocaine and anesthesia may have negative outcomes, up to and including  death. If you test positive for cocaine, your surgery will be cancelled.  On the morning of surgery brush your teeth with toothpaste and water, you may rinse your mouth with mouthwash if you wish. Do not swallow any toothpaste or mouthwash.  Use CHG Soap or wipes as directed on instruction sheet.  Do not wear lotions, powders, or perfumes.   Do not shave body hair from the neck down 48 hours before surgery.  Wear comfortable clothing (specific to your surgery type) to the hospital.  Do not wear jewelry, make-up, hairpins, clips or nail polish.  Contact lenses, hearing aids and dentures may not be worn into surgery.  Do not bring valuables to the hospital. [facility] is not responsible for any missing/lost belongings or valuables.   Notify your doctor if there is any change in your medical condition (cold, fever, infection).  If you are being discharged the day of surgery, you will not be allowed to drive home. You will need a responsible individual to drive you home and stay with you for 24 hours after surgery.   If you are taking public transportation, you will need to have a responsible individual with you.  If you are being admitted to the hospital overnight, leave your suitcase in the car. After surgery it may be brought to your room.  In case of increased patient census, it may be necessary for you, the patient, to continue your postoperative care in the Same Day Surgery department.  After surgery, you can help prevent lung  complications by doing breathing exercises.  Take deep breaths and cough every 1-2 hours. Your doctor may order a device called an Incentive Spirometer to help you take deep breaths. When coughing or sneezing, hold a pillow firmly against your incision with both hands. This is called "splinting." Doing this helps protect your incision. It also decreases belly discomfort.  Surgery Visitation Policy:  Patients undergoing a surgery or procedure may have two  family members or support persons with them as long as the person is not COVID-19 positive or experiencing its symptoms.   Inpatient Visitation:    Visiting hours are 7 a.m. to 8 p.m. Up to four visitors are allowed at one time in a patient room. The visitors may rotate out with other people during the day. One designated support person (adult) may remain overnight.  Please call the Pre-admissions Testing Dept. at [phone] if you have any questions about these instructions.     Preparing for Surgery with CHLORHEXIDINE GLUCONATE (CHG) Soap  Chlorhexidine Gluconate (CHG) Soap  o An antiseptic cleaner that kills germs and bonds with the skin to continue killing germs even after washing  o Used for showering the night before surgery and morning of surgery  Before surgery, you can play an important role by reducing the number of germs on your skin.  CHG (Chlorhexidine gluconate) soap is an antiseptic cleanser which kills germs and bonds with the skin to continue killing germs even after washing.  Please do not use if you have an allergy to CHG or antibacterial soaps. If your skin becomes reddened/irritated stop using the CHG.  1. Shower the NIGHT BEFORE SURGERY and the MORNING OF SURGERY with CHG soap.  2. If you choose to wash your hair, wash your hair first as usual with your normal shampoo.  3. After shampooing, rinse your hair and body thoroughly to remove the shampoo.  4. Use CHG as you would any other liquid soap. You can apply CHG directly to the skin and wash gently with a scrungie or a clean washcloth.  5. Apply the CHG soap to your body only from the neck down. Do not use on open wounds or open sores. Avoid contact with your eyes, ears, mouth, and genitals (private parts). Wash face and genitals (private parts) with your normal soap.  6. Wash thoroughly, paying special attention to the area where your surgery will be performed.  7. Thoroughly rinse your body with warm  water.  8. Do not shower/wash with your normal soap after using and rinsing off the CHG soap.  9. Pat yourself dry with a clean towel.  10. Wear clean pajamas to bed the night before surgery.  12. Place clean sheets on your bed the night of your first shower and do not sleep with pets.  13. Shower again with the CHG soap on the day of surgery prior to arriving at the hospital.  14. Do not apply any deodorants/lotions/powders.  15. Please wear clean clothes to the hospital.

## 2023-08-11 ENCOUNTER — Encounter: Payer: Self-pay | Admitting: Urgent Care

## 2023-08-11 ENCOUNTER — Encounter
Admission: RE | Admit: 2023-08-11 | Discharge: 2023-08-11 | Disposition: A | Discharge status: Home / Self Care | Payer: Medicare HMO | Source: Ambulatory Visit | Attending: Surgery | Admitting: Surgery

## 2023-08-11 DIAGNOSIS — I10 Essential (primary) hypertension: Secondary | ICD-10-CM | POA: Insufficient documentation

## 2023-08-11 DIAGNOSIS — E876 Hypokalemia: Secondary | ICD-10-CM | POA: Insufficient documentation

## 2023-08-11 DIAGNOSIS — E119 Type 2 diabetes mellitus without complications: Secondary | ICD-10-CM | POA: Diagnosis not present

## 2023-08-11 DIAGNOSIS — T502X5A Adverse effect of carbonic-anhydrase inhibitors, benzothiadiazides and other diuretics, initial encounter: Secondary | ICD-10-CM | POA: Insufficient documentation

## 2023-08-11 DIAGNOSIS — Z01812 Encounter for preprocedural laboratory examination: Secondary | ICD-10-CM | POA: Diagnosis not present

## 2023-08-11 DIAGNOSIS — Z79899 Other long term (current) drug therapy: Secondary | ICD-10-CM | POA: Diagnosis not present

## 2023-08-11 LAB — BASIC METABOLIC PANEL
Anion gap: 9 (ref 5–15)
BUN: 14 mg/dL (ref 8–23)
CO2: 27 mmol/L (ref 22–32)
Calcium: 9 mg/dL (ref 8.9–10.3)
Chloride: 102 mmol/L (ref 98–111)
Creatinine, Ser: 0.72 mg/dL (ref 0.44–1.00)
GFR, Estimated: >60 mL/min (ref >60)
Glucose, Bld: 167 mg/dL — ABNORMAL HIGH (ref 70–99)
Potassium: 3.4 mmol/L — ABNORMAL LOW (ref 3.5–5.1)
Sodium: 138 mmol/L (ref 135–145)

## 2023-08-15 ENCOUNTER — Encounter: Payer: Self-pay | Admitting: Surgery

## 2023-08-16 NOTE — Anesthesia Preprocedure Evaluation (Signed)
Anesthesia Evaluation  Patient identified by MRN, date of birth, ID band Patient awake    Reviewed: Allergy & Precautions, NPO status , Patient's Chart, lab work & pertinent test results  History of Anesthesia Complications (+) PONV and history of anesthetic complications  Airway Mallampati: I   Neck ROM: Full    Dental  (+) Missing   Pulmonary neg pulmonary ROS   Pulmonary exam normal breath sounds clear to auscultation       Cardiovascular hypertension, +CHF (preserved EF)  Normal cardiovascular exam+ dysrhythmias (a fib)  Rhythm:Regular Rate:Normal  ECG 06/05/23: normal  Myocardial perfusion 07/13/22:  1.  Normal left ventricular function  2.  Normal wall motion  3.  No evidence for scar or ischemia   Echo 10/21/19:  1. Left ventricular ejection fraction, by visual estimation, is 60 to 65%. The left ventricle has normal function. There is mildly increased left ventricular hypertrophy.  2. Left ventricular diastolic parameters are indeterminate.  3. Global right ventricle has normal systolic function.The right ventricular size is mildly enlarged. No increase in right ventricular wall thickness.  4. Left atrial size was mildly dilated.  5. Right atrial size was mildly dilated.  6. The mitral valve is grossly normal. Trace mitral valve regurgitation. No evidence of mitral stenosis.  7. The tricuspid valve is normal in structure. Tricuspid valve regurgitation is trivial.  8. The aortic valve is normal in structure. Aortic valve regurgitation is not visualized. No evidence of aortic valve sclerosis or stenosis.  9. The pulmonic valve was normal in structure. Pulmonic valve regurgitation is not visualized.  10. Normal pulmonary artery systolic pressure.  11. The inferior vena cava is normal in size with greater than 50% respiratory variability, suggesting right atrial pressure of 3 mmHg.    Neuro/Psych Chronic pain     GI/Hepatic ,GERD  ,,  Endo/Other  diabetes, Type 2Hypothyroidism  Obesity   Renal/GU Renal disease (nephrolithiasis)     Musculoskeletal  (+) Arthritis ,    Abdominal   Peds  Hematology negative hematology ROS (+)   Anesthesia Other Findings Reviewed and agree with Amanda Holmes pre-anesthesia clinical review note.    Cardiology note 04/24/23:  72 year old female with paroxysmal atrial fibrillation in the setting of hyperthyroidism, on sotalol for rhythm control. Previous 72-hour Holter monitor revealed predominant sinus rhythm. The patient has essential hypertension, blood pressure adequately controlled today on current BP medications. The patient reported a 3-4 month history of progressive exertional dyspnea, orthopnea, and weight gain. She was started on Lasix 20 mg with 13 pound weight loss and overall improvement, with mild residual exertional dyspnea when walking at an incline, with overall improvement with Lasix increased to 40 mg daily. Lexiscan Myoview was negative for evidence of scar or ischemia. She discontinued Xarelto due to needing to take NSAIDs for knee pain, as well as due to cost and having only one apparent episode of atrial fibrillation in the setting of hypothyroid. Repeat Holter monitor is negative for evidence of atrial fibrillation. Discussed with the patient that she remains at risk for recurrent atrial fibrillation with a history of it. At this time, she would like to remain off of anticoagulation. The patient reports a 2 week history of chest tightness and shortness of breath with typical and atypical features. Lexiscan Myoview was negative for evidence of scar or ischemia. Chest x-ray was slightly abnormal, revealing small/trace left-sided pleural effusion. Her Lasix was increased to 40 mg twice daily for 5 days. She now takes it daily and  one additional for overnight weight gain of 3 or more pounds. Her breathing has improved.  Plan   1. Continue current  medications 2. Counseled patient about low-sodium diet 3. DASH diet printed instructions given to the patient 4. Defer chronic anticoagulation per patient's wishes 5. Continue sotalol for rhythm control 6. Plan to repeat echo next visit 7. Return to clinic for follow-up in 6 months  No orders of the defined types were placed in this encounter.  Return in about 6 months (around 10/25/2023).    Reproductive/Obstetrics                             Anesthesia Physical Anesthesia Plan  ASA: 3  Anesthesia Plan: General   Post-op Pain Management:    Induction: Intravenous  PONV Risk Score and Plan: 4 or greater and Ondansetron, Dexamethasone, Treatment may vary due to age or medical condition and Scopolamine patch - Pre-op  Airway Management Planned: Oral ETT  Additional Equipment:   Intra-op Plan:   Post-operative Plan: Extubation in OR  Informed Consent: I have reviewed the patients History and Physical, chart, labs and discussed the procedure including the risks, benefits and alternatives for the proposed anesthesia with the patient or authorized representative who has indicated his/her understanding and acceptance.     Dental advisory given  Plan Discussed with: CRNA  Anesthesia Plan Comments: (Patient consented for risks of anesthesia including but not limited to:  - adverse reactions to medications - damage to eyes, teeth, lips or other oral mucosa - nerve damage due to positioning  - sore throat or hoarseness - damage to heart, brain, nerves, lungs, other parts of body or loss of life  Informed patient about role of CRNA in peri- and intra-operative care.  Patient voiced understanding.)        Anesthesia Quick Evaluation

## 2023-08-17 ENCOUNTER — Encounter: Payer: Self-pay | Admitting: Surgery

## 2023-08-17 ENCOUNTER — Other Ambulatory Visit: Payer: Self-pay

## 2023-08-17 ENCOUNTER — Ambulatory Visit
Admission: RE | Admit: 2023-08-17 | Discharge: 2023-08-17 | Disposition: A | Discharge status: Home / Self Care | Payer: Medicare HMO | Attending: Surgery | Admitting: Surgery

## 2023-08-17 ENCOUNTER — Encounter
Admission: RE | Disposition: A | Discharge status: Home / Self Care | Payer: Self-pay | Source: Home / Self Care | Attending: Surgery

## 2023-08-17 ENCOUNTER — Ambulatory Visit: Payer: Medicare HMO | Admitting: Urgent Care

## 2023-08-17 DIAGNOSIS — I4891 Unspecified atrial fibrillation: Secondary | ICD-10-CM | POA: Diagnosis not present

## 2023-08-17 DIAGNOSIS — T502X5A Adverse effect of carbonic-anhydrase inhibitors, benzothiadiazides and other diuretics, initial encounter: Secondary | ICD-10-CM

## 2023-08-17 DIAGNOSIS — K851 Biliary acute pancreatitis without necrosis or infection: Secondary | ICD-10-CM | POA: Insufficient documentation

## 2023-08-17 DIAGNOSIS — Z01812 Encounter for preprocedural laboratory examination: Secondary | ICD-10-CM

## 2023-08-17 DIAGNOSIS — E876 Hypokalemia: Secondary | ICD-10-CM

## 2023-08-17 DIAGNOSIS — E669 Obesity, unspecified: Secondary | ICD-10-CM | POA: Diagnosis not present

## 2023-08-17 DIAGNOSIS — I5032 Chronic diastolic (congestive) heart failure: Secondary | ICD-10-CM | POA: Diagnosis not present

## 2023-08-17 DIAGNOSIS — Z6839 Body mass index (BMI) 39.0-39.9, adult: Secondary | ICD-10-CM | POA: Insufficient documentation

## 2023-08-17 DIAGNOSIS — I110 Hypertensive heart disease with heart failure: Secondary | ICD-10-CM | POA: Diagnosis not present

## 2023-08-17 DIAGNOSIS — E119 Type 2 diabetes mellitus without complications: Secondary | ICD-10-CM | POA: Diagnosis not present

## 2023-08-17 DIAGNOSIS — E039 Hypothyroidism, unspecified: Secondary | ICD-10-CM | POA: Diagnosis not present

## 2023-08-17 DIAGNOSIS — I10 Essential (primary) hypertension: Secondary | ICD-10-CM

## 2023-08-17 DIAGNOSIS — Z79899 Other long term (current) drug therapy: Secondary | ICD-10-CM

## 2023-08-17 HISTORY — DX: Chronic pain syndrome: G89.4

## 2023-08-17 HISTORY — DX: Biliary acute pancreatitis without necrosis or infection: K85.10

## 2023-08-17 LAB — GLUCOSE, CAPILLARY
Glucose-Capillary: 133 mg/dL — ABNORMAL HIGH (ref 70–99)
Glucose-Capillary: 186 mg/dL — ABNORMAL HIGH (ref 70–99)

## 2023-08-17 SURGERY — CHOLECYSTECTOMY, ROBOT-ASSISTED, LAPAROSCOPIC
Anesthesia: General

## 2023-08-17 MED ORDER — EPHEDRINE 5 MG/ML INJ
INTRAVENOUS | Status: AC
  Filled 2023-08-17: qty 5

## 2023-08-17 MED ORDER — ACETAMINOPHEN 500 MG PO TABS
1000.0000 mg | ORAL_TABLET | ORAL | Status: AC
  Administered 2023-08-17: 1000 mg via ORAL

## 2023-08-17 MED ORDER — MIDAZOLAM HCL 2 MG/2ML IJ SOLN
INTRAMUSCULAR | Status: DC | PRN
  Administered 2023-08-17: 2 mg via INTRAVENOUS

## 2023-08-17 MED ORDER — PROPOFOL 10 MG/ML IV BOLUS
INTRAVENOUS | Status: DC | PRN
  Administered 2023-08-17: 160 mg via INTRAVENOUS
  Administered 2023-08-17: 20 ug/kg/min via INTRAVENOUS

## 2023-08-17 MED ORDER — OXYCODONE HCL 5 MG PO TABS: 5.0000 mg | ORAL_TABLET | ORAL | 0 refills | Status: DC | PRN

## 2023-08-17 MED ORDER — LIDOCAINE HCL (CARDIAC) PF 100 MG/5ML IV SOSY
PREFILLED_SYRINGE | INTRAVENOUS | Status: DC | PRN
  Administered 2023-08-17: 100 mg via INTRAVENOUS

## 2023-08-17 MED ORDER — FENTANYL CITRATE (PF) 100 MCG/2ML IJ SOLN
INTRAMUSCULAR | Status: DC | PRN
  Administered 2023-08-17: 25 ug via INTRAVENOUS
  Administered 2023-08-17: 50 ug via INTRAVENOUS
  Administered 2023-08-17: 25 ug via INTRAVENOUS

## 2023-08-17 MED ORDER — FAMOTIDINE 20 MG PO TABS
ORAL_TABLET | ORAL | Status: AC
  Filled 2023-08-17: qty 1

## 2023-08-17 MED ORDER — ROCURONIUM BROMIDE 10 MG/ML (PF) SYRINGE
PREFILLED_SYRINGE | INTRAVENOUS | Status: AC
  Filled 2023-08-17: qty 10

## 2023-08-17 MED ORDER — CIPROFLOXACIN IN D5W 400 MG/200ML IV SOLN
400.0000 mg | INTRAVENOUS | Status: AC
  Administered 2023-08-17: 400 mg via INTRAVENOUS

## 2023-08-17 MED ORDER — BUPIVACAINE-EPINEPHRINE (PF) 0.25% -1:200000 IJ SOLN
INTRAMUSCULAR | Status: DC | PRN
  Administered 2023-08-17: 30 mL

## 2023-08-17 MED ORDER — FENTANYL CITRATE (PF) 100 MCG/2ML IJ SOLN
INTRAMUSCULAR | Status: AC
  Filled 2023-08-17: qty 2

## 2023-08-17 MED ORDER — SCOPOLAMINE 1 MG/3DAYS TD PT72
MEDICATED_PATCH | TRANSDERMAL | Status: AC
  Filled 2023-08-17: qty 1

## 2023-08-17 MED ORDER — DEXAMETHASONE SODIUM PHOSPHATE 10 MG/ML IJ SOLN
INTRAMUSCULAR | Status: AC
  Filled 2023-08-17: qty 1

## 2023-08-17 MED ORDER — PROPOFOL 10 MG/ML IV BOLUS
INTRAVENOUS | Status: AC
  Filled 2023-08-17: qty 40

## 2023-08-17 MED ORDER — SODIUM CHLORIDE 0.9 % IV SOLN: INTRAVENOUS | Status: DC

## 2023-08-17 MED ORDER — FAMOTIDINE 20 MG PO TABS
20.0000 mg | ORAL_TABLET | Freq: Once | ORAL | Status: AC
  Administered 2023-08-17: 20 mg via ORAL

## 2023-08-17 MED ORDER — MIDAZOLAM HCL 2 MG/2ML IJ SOLN
INTRAMUSCULAR | Status: AC
  Filled 2023-08-17: qty 2

## 2023-08-17 MED ORDER — ORAL CARE MOUTH RINSE: 15.0000 mL | Freq: Once | OROMUCOSAL | Status: AC

## 2023-08-17 MED ORDER — OXYCODONE HCL 5 MG PO TABS
5.0000 mg | ORAL_TABLET | Freq: Once | ORAL | Status: AC | PRN
  Administered 2023-08-17: 5 mg via ORAL

## 2023-08-17 MED ORDER — ACETAMINOPHEN 500 MG PO TABS
ORAL_TABLET | ORAL | Status: AC
  Filled 2023-08-17: qty 2

## 2023-08-17 MED ORDER — DEXAMETHASONE SODIUM PHOSPHATE 10 MG/ML IJ SOLN
INTRAMUSCULAR | Status: DC | PRN
  Administered 2023-08-17: 10 mg via INTRAVENOUS

## 2023-08-17 MED ORDER — INDOCYANINE GREEN 25 MG IV SOLR
INTRAVENOUS | Status: AC
  Filled 2023-08-17: qty 10

## 2023-08-17 MED ORDER — ONDANSETRON HCL 4 MG/2ML IJ SOLN
INTRAMUSCULAR | Status: DC | PRN
  Administered 2023-08-17: 4 mg via INTRAVENOUS

## 2023-08-17 MED ORDER — GABAPENTIN 300 MG PO CAPS
300.0000 mg | ORAL_CAPSULE | ORAL | Status: AC
  Administered 2023-08-17: 300 mg via ORAL

## 2023-08-17 MED ORDER — CHLORHEXIDINE GLUCONATE 0.12 % MT SOLN
OROMUCOSAL | Status: AC
  Filled 2023-08-17: qty 15

## 2023-08-17 MED ORDER — INDOCYANINE GREEN 25 MG IV SOLR
2.5000 mg | INTRAVENOUS | Status: AC
  Administered 2023-08-17: 2.5 mg via INTRAVENOUS

## 2023-08-17 MED ORDER — GABAPENTIN 300 MG PO CAPS
ORAL_CAPSULE | ORAL | Status: AC
  Filled 2023-08-17: qty 1

## 2023-08-17 MED ORDER — ONDANSETRON HCL 4 MG/2ML IJ SOLN
4.0000 mg | Freq: Once | INTRAMUSCULAR | Status: AC | PRN
  Administered 2023-08-17: 4 mg via INTRAVENOUS

## 2023-08-17 MED ORDER — OXYCODONE HCL 5 MG/5ML PO SOLN: 5.0000 mg | Freq: Once | ORAL | Status: AC | PRN

## 2023-08-17 MED ORDER — ONDANSETRON HCL 4 MG/2ML IJ SOLN
INTRAMUSCULAR | Status: AC
  Filled 2023-08-17: qty 2

## 2023-08-17 MED ORDER — CHLORHEXIDINE GLUCONATE 0.12 % MT SOLN
15.0000 mL | Freq: Once | OROMUCOSAL | Status: AC
  Administered 2023-08-17: 15 mL via OROMUCOSAL

## 2023-08-17 MED ORDER — BUPIVACAINE LIPOSOME 1.3 % IJ SUSP: 20.0000 mL | Freq: Once | INTRAMUSCULAR | Status: DC

## 2023-08-17 MED ORDER — METRONIDAZOLE 500 MG/100ML IV SOLN
500.0000 mg | INTRAVENOUS | Status: AC
  Administered 2023-08-17: 500 mg via INTRAVENOUS
  Filled 2023-08-17: qty 100

## 2023-08-17 MED ORDER — FENTANYL CITRATE (PF) 100 MCG/2ML IJ SOLN
25.0000 ug | INTRAMUSCULAR | Status: DC | PRN
  Administered 2023-08-17 (×2): 25 ug via INTRAVENOUS

## 2023-08-17 MED ORDER — CHLORHEXIDINE GLUCONATE CLOTH 2 % EX PADS
6.0000 | MEDICATED_PAD | Freq: Once | CUTANEOUS | Status: AC
  Administered 2023-08-17: 6 via TOPICAL

## 2023-08-17 MED ORDER — EPHEDRINE SULFATE (PRESSORS) 50 MG/ML IJ SOLN
INTRAMUSCULAR | Status: DC | PRN
  Administered 2023-08-17: 10 mg via INTRAVENOUS

## 2023-08-17 MED ORDER — LACTATED RINGERS IV SOLN: INTRAVENOUS | Status: DC

## 2023-08-17 MED ORDER — ACETAMINOPHEN 10 MG/ML IV SOLN: 1000.0000 mg | Freq: Once | INTRAVENOUS | Status: DC | PRN

## 2023-08-17 MED ORDER — CIPROFLOXACIN IN D5W 400 MG/200ML IV SOLN
INTRAVENOUS | Status: AC
  Filled 2023-08-17: qty 200

## 2023-08-17 MED ORDER — SCOPOLAMINE 1 MG/3DAYS TD PT72
1.0000 | MEDICATED_PATCH | TRANSDERMAL | Status: DC
  Administered 2023-08-17: 1.5 mg via TRANSDERMAL

## 2023-08-17 MED ORDER — ROCURONIUM BROMIDE 100 MG/10ML IV SOLN
INTRAVENOUS | Status: DC | PRN
  Administered 2023-08-17: 20 mg via INTRAVENOUS
  Administered 2023-08-17: 50 mg via INTRAVENOUS

## 2023-08-17 MED ORDER — CHLORHEXIDINE GLUCONATE CLOTH 2 % EX PADS: 6.0000 | MEDICATED_PAD | Freq: Once | CUTANEOUS | Status: DC

## 2023-08-17 MED ORDER — BUPIVACAINE-EPINEPHRINE (PF) 0.25% -1:200000 IJ SOLN
INTRAMUSCULAR | Status: AC
  Filled 2023-08-17: qty 30

## 2023-08-17 MED ORDER — PROPOFOL 1000 MG/100ML IV EMUL
INTRAVENOUS | Status: AC
  Filled 2023-08-17: qty 200

## 2023-08-17 MED ORDER — SUGAMMADEX SODIUM 200 MG/2ML IV SOLN
INTRAVENOUS | Status: DC | PRN
  Administered 2023-08-17: 200 mg via INTRAVENOUS

## 2023-08-17 MED ORDER — ACETAMINOPHEN 500 MG PO TABS: 1000.0000 mg | ORAL_TABLET | Freq: Four times a day (QID) | ORAL | Status: AC | PRN

## 2023-08-17 MED ORDER — OXYCODONE HCL 5 MG PO TABS
ORAL_TABLET | ORAL | Status: AC
  Filled 2023-08-17: qty 1

## 2023-08-17 SURGICAL SUPPLY — 53 items
ADH SKN CLS APL DERMABOND .7 (GAUZE/BANDAGES/DRESSINGS) ×1
APL SRG 38 LTWT LNG FL B (MISCELLANEOUS) ×1
APPLICATOR ARISTA FLEXITIP XL (MISCELLANEOUS) IMPLANT
BAG PRESSURE INF REUSE 1000 (BAG) IMPLANT
CANNULA CAP OBTURATR AIRSEAL 8 (CAP) IMPLANT
CAUTERY HOOK MNPLR 1.6 DVNC XI (INSTRUMENTS) ×1 IMPLANT
CLIP LIGATING HEMO O LOK GREEN (MISCELLANEOUS) ×1 IMPLANT
DERMABOND ADVANCED .7 DNX12 (GAUZE/BANDAGES/DRESSINGS) ×1 IMPLANT
DRAPE ARM DVNC X/XI (DISPOSABLE) ×4 IMPLANT
DRAPE COLUMN DVNC XI (DISPOSABLE) ×1 IMPLANT
ELECT CAUTERY BLADE 6.4 (BLADE) ×1 IMPLANT
ELECT CAUTERY BLADE TIP 2.5 (TIP) ×1
ELECT REM PT RETURN 9FT ADLT (ELECTROSURGICAL) ×1
ELECTRODE CAUTERY BLDE TIP 2.5 (TIP) ×1 IMPLANT
ELECTRODE REM PT RTRN 9FT ADLT (ELECTROSURGICAL) ×1 IMPLANT
FORCEPS BPLR R/ABLATION 8 DVNC (INSTRUMENTS) ×1 IMPLANT
FORCEPS PROGRASP DVNC XI (FORCEP) ×1 IMPLANT
GLOVE SURG SYN 7.0 (GLOVE) ×4
GLOVE SURG SYN 7.0 PF PI (GLOVE) ×2 IMPLANT
GLOVE SURG SYN 7.5 E (GLOVE) ×4
GLOVE SURG SYN 7.5 PF PI (GLOVE) ×2 IMPLANT
GOWN STRL REUS W/ TWL LRG LVL3 (GOWN DISPOSABLE) ×4 IMPLANT
GOWN STRL REUS W/TWL LRG LVL3 (GOWN DISPOSABLE) ×4
HEMOSTAT ARISTA ABSORB 3G PWDR (HEMOSTASIS) IMPLANT
IRRIGATOR SUCT 8 DISP DVNC XI (IRRIGATION / IRRIGATOR) IMPLANT
IV NS 1000ML (IV SOLUTION) ×1
IV NS 1000ML BAXH (IV SOLUTION) IMPLANT
KIT PINK PAD W/HEAD ARE REST (MISCELLANEOUS) ×1
KIT PINK PAD W/HEAD ARM REST (MISCELLANEOUS) ×1 IMPLANT
LABEL OR SOLS (LABEL) ×1 IMPLANT
MANIFOLD NEPTUNE II (INSTRUMENTS) ×1 IMPLANT
NDL HYPO 22X1.5 SAFETY MO (MISCELLANEOUS) ×1 IMPLANT
NEEDLE HYPO 22X1.5 SAFETY MO (MISCELLANEOUS) ×1
NS IRRIG 500ML POUR BTL (IV SOLUTION) ×1 IMPLANT
OBTURATOR OPTICAL STND 8 DVNC (TROCAR) ×1
OBTURATOR OPTICALSTD 8 DVNC (TROCAR) ×1 IMPLANT
PACK LAP CHOLECYSTECTOMY (MISCELLANEOUS) ×1 IMPLANT
PENCIL SMOKE EVACUATOR (MISCELLANEOUS) ×1 IMPLANT
SEAL UNIV 5-12 XI (MISCELLANEOUS) ×4 IMPLANT
SET TUBE FILTERED XL AIRSEAL (SET/KITS/TRAYS/PACK) IMPLANT
SET TUBE SMOKE EVAC HIGH FLOW (TUBING) ×1 IMPLANT
SOL ELECTROSURG ANTI STICK (MISCELLANEOUS) ×1
SOLUTION ELECTROSURG ANTI STCK (MISCELLANEOUS) ×1 IMPLANT
SPIKE FLUID TRANSFER (MISCELLANEOUS) ×1 IMPLANT
SPONGE T-LAP 18X18 ~~LOC~~+RFID (SPONGE) IMPLANT
SPONGE T-LAP 4X18 ~~LOC~~+RFID (SPONGE) ×1 IMPLANT
SUT MNCRL AB 4-0 PS2 18 (SUTURE) ×1 IMPLANT
SUT VIC AB 3-0 SH 27 (SUTURE)
SUT VIC AB 3-0 SH 27X BRD (SUTURE) IMPLANT
SUT VICRYL 0 UR6 27IN ABS (SUTURE) ×2 IMPLANT
SYS BAG RETRIEVAL 10MM (BASKET) ×1
SYSTEM BAG RETRIEVAL 10MM (BASKET) ×1 IMPLANT
WATER STERILE IRR 500ML POUR (IV SOLUTION) ×1 IMPLANT

## 2023-08-17 NOTE — Anesthesia Postprocedure Evaluation (Signed)
Anesthesia Post Note  Patient: Amanda Holmes  Procedure(s) Performed: XI ROBOTIC ASSISTED LAPAROSCOPIC CHOLECYSTECTOMY INDOCYANINE GREEN FLUORESCENCE IMAGING (ICG)  Patient location during evaluation: PACU Anesthesia Type: General Level of consciousness: awake and alert Pain management: pain level controlled Vital Signs Assessment: post-procedure vital signs reviewed and stable Respiratory status: spontaneous breathing, nonlabored ventilation, respiratory function stable and patient connected to nasal cannula oxygen Cardiovascular status: blood pressure returned to baseline and stable Postop Assessment: no apparent nausea or vomiting Anesthetic complications: no   No notable events documented.   Last Vitals:  Vitals:   08/17/23 1028 08/17/23 1120  BP: (!) 173/70 (!) 169/62  Pulse: (!) 55   Resp: 18   Temp: 36.4 C   SpO2: 92% 95%    Last Pain:  Vitals:   08/17/23 1028  TempSrc: Temporal  PainSc: 0-No pain                 Lenard Simmer

## 2023-08-17 NOTE — Anesthesia Procedure Notes (Signed)
Procedure Name: Intubation Date/Time: 08/17/2023 7:40 AM  Performed by: Elisabeth Pigeon, CRNAPre-anesthesia Checklist: Patient identified, Patient being monitored, Timeout performed, Emergency Drugs available and Suction available Patient Re-evaluated:Patient Re-evaluated prior to induction Oxygen Delivery Method: Circle system utilized Preoxygenation: Pre-oxygenation with 100% oxygen Induction Type: IV induction Ventilation: Mask ventilation without difficulty Laryngoscope Size: Mac, 3 and McGraph Grade View: Grade I Tube type: Oral Tube size: 6.5 mm Number of attempts: 1 Airway Equipment and Method: Stylet Placement Confirmation: ETT inserted through vocal cords under direct vision, positive ETCO2 and breath sounds checked- equal and bilateral Secured at: 20 cm Tube secured with: Tape Dental Injury: Teeth and Oropharynx as per pre-operative assessment

## 2023-08-17 NOTE — Discharge Instructions (Addendum)
Discharge Instructions 1.  Patient may shower, but do not scrub wounds heavily and dab dry only. 2.  Do not submerge wounds in pool/tub until fully healed. 3.  Do not apply ointments or hydrogen peroxide to the wounds. 4.  May apply ice packs to the wounds for comfort. 5.  Do not drive while taking narcotics for pain control.  Prior to driving, make sure you are able to rotate right and left to look at blindspots without significant pain or discomfort. 6.  No heavy lifting or pushing of more than 10-15 lbs for 4 weeks.  AMBULATORY SURGERY  DISCHARGE INSTRUCTIONS   The drugs that you were given will stay in your system until tomorrow so for the next 24 hours you should not:  Drive an automobile Make any legal decisions Drink any alcoholic beverage   You may resume regular meals tomorrow.  Today it is better to start with liquids and gradually work up to solid foods.  You may eat anything you prefer, but it is better to start with liquids, then soup and crackers, and gradually work up to solid foods.   Please notify your doctor immediately if you have any unusual bleeding, trouble breathing, redness and pain at the surgery site, drainage, fever, or pain not relieved by medication.    Your post-operative visit with Dr.                                       is: Date:                        Time:    Please call to schedule your post-operative visit.  Additional Instructions:

## 2023-08-17 NOTE — Transfer of Care (Signed)
Immediate Anesthesia Transfer of Care Note  Patient: Amanda Holmes  Procedure(s) Performed: XI ROBOTIC ASSISTED LAPAROSCOPIC CHOLECYSTECTOMY INDOCYANINE GREEN FLUORESCENCE IMAGING (ICG)  Patient Location: PACU  Anesthesia Type:General  Level of Consciousness: awake  Airway & Oxygen Therapy: Patient Spontanous Breathing and Patient connected to face mask oxygen  Post-op Assessment: Report given to RN and Post -op Vital signs reviewed and stable  Post vital signs: Reviewed and stable  Last Vitals:  Vitals Value Taken Time  BP 175/73 08/17/23 0915  Temp    Pulse 54 08/17/23 0918  Resp 16 08/17/23 0918  SpO2 100 % 08/17/23 0918  Vitals shown include unfiled device data.  Last Pain:  Vitals:   08/17/23 0623  TempSrc: Temporal         Complications: No notable events documented.

## 2023-08-17 NOTE — Interval H&P Note (Signed)
History and Physical Interval Note:  08/17/2023 7:07 AM  Amanda Holmes  has presented today for surgery, with the diagnosis of gallstone pancreatitis K85.10.  The various methods of treatment have been discussed with the patient and family. After consideration of risks, benefits and other options for treatment, the patient has consented to  Procedure(s): XI ROBOTIC ASSISTED LAPAROSCOPIC CHOLECYSTECTOMY (N/A) INDOCYANINE GREEN FLUORESCENCE IMAGING (ICG) (N/A) as a surgical intervention.  The patient's history has been reviewed, patient examined, no change in status, stable for surgery.  I have reviewed the patient's chart and labs.  Questions were answered to the patient's satisfaction.     Bryan Goin

## 2023-08-17 NOTE — Op Note (Signed)
  Procedure Date:  08/17/2023  Pre-operative Diagnosis:  Gallstone pancreatitis  Post-operative Diagnosis: Gallstone pancreatitis  Procedure:  Robotic assisted cholecystectomy with ICG FireFly cholangiogram  Surgeon:  Howie Ill, MD  Anesthesia:  General endotracheal  Estimated Blood Loss:  30 ml  Specimens:  gallbladder  Complications:  None  Indications for Procedure:  This is a 72 y.o. female with a history of gallstone pancreatitis, now presenting for interval cholecystectomy.  The benefits, complications, treatment options, and expected outcomes were discussed with the patient. The risks of bleeding, infection, recurrence of symptoms, failure to resolve symptoms, bile duct damage, bile duct leak, retained common bile duct stone, bowel injury, and need for further procedures were all discussed with the patient and she was willing to proceed.  Description of Procedure: The patient was correctly identified in the preoperative area and brought into the operating room.  The patient was placed supine with VTE prophylaxis in place.  Appropriate time-outs were performed.  Anesthesia was induced and the patient was intubated.  Appropriate antibiotics were infused.  The abdomen was prepped and draped in a sterile fashion. An infraumbilical incision was made. A cutdown technique was used to enter the abdominal cavity without injury, and a 12 mm robotic port was inserted.  Pneumoperitoneum was obtained with appropriate opening pressures.  Three 8-mm ports were placed in the mid abdomen at the level of the umbilicus under direct visualization.  The DaVinci platform was docked, camera targeted, and instruments were placed under direct visualization.  The gallbladder was identified.  The fundus was grasped and retracted cephalad.  Adhesions were lysed bluntly and with electrocautery. The infundibulum was grasped and retracted laterally, exposing the peritoneum overlying the gallbladder.  This was  incised with electrocautery and extended on either side of the gallbladder.  FireFly cholangiogram was then obtained, and we were able to clearly identify the cystic duct and common bile duct.  The cystic duct and cystic artery were carefully dissected with combination of cautery and blunt dissection.  Both were clipped twice proximally and once distally, cutting in between.  The gallbladder was taken from the gallbladder fossa in a retrograde fashion with electrocautery. The gallbladder was placed in an Endocatch bag. The liver bed was inspected and any bleeding was controlled with electrocautery. The right upper quadrant was then inspected again revealing intact clips, no bleeding, and no ductal injury.  The area was thoroughly irrigated.  The 8 mm ports were removed under direct visualization and the 12 mm port was removed.  The Endocatch bag was brought out via the umbilical incision. The fascial opening was closed using 0 vicryl suture.  Local anesthetic was infused in all incisions and the incisions were closed with 4-0 Monocryl.  The wounds were cleaned and sealed with DermaBond.  The patient was emerged from anesthesia and extubated and brought to the recovery room for further management.  The patient tolerated the procedure well and all counts were correct at the end of the case.   Howie Ill, MD

## 2023-08-21 NOTE — Group Note (Deleted)

## 2023-08-29 ENCOUNTER — Ambulatory Visit (INDEPENDENT_AMBULATORY_CARE_PROVIDER_SITE_OTHER): Payer: Medicare HMO | Admitting: Physician Assistant

## 2023-08-29 ENCOUNTER — Encounter: Payer: Self-pay | Admitting: Physician Assistant

## 2023-08-29 VITALS — BP 138/82 | HR 72 | Temp 98.0°F | Ht 64.0 in | Wt 230.0 lb

## 2023-08-29 DIAGNOSIS — K851 Biliary acute pancreatitis without necrosis or infection: Secondary | ICD-10-CM

## 2023-08-29 DIAGNOSIS — Z09 Encounter for follow-up examination after completed treatment for conditions other than malignant neoplasm: Secondary | ICD-10-CM

## 2023-08-29 NOTE — Patient Instructions (Signed)

## 2023-08-29 NOTE — Progress Notes (Signed)
[facility] POST-OP OFFICE VISIT  08/29/2023  HPI: Ronny A Otsuka is a 72 y.o. female 12 days s/p robotic assisted laparoscopic cholecystectomy for gallstone pancreatitis with Dr Aleen Campi   She is doing very well No significant complaints No fever, chills, nausea, emesis Expected, minimal, incisional soreness Incisions are healing well No other complaints   Vital signs: BP 138/82   Pulse 72   Temp 98 F (36.7 C)   Ht 5\' 4"  (1.626 m)   Wt 230 lb (104.3 kg)   SpO2 97%   BMI 39.48 kg/m    Physical Exam: Constitutional: Well appearing female, NAD Abdomen: Soft, non-tender, non-distended, no rebound/guarding Skin: Laparoscopic incisions are healing well, no erythema or drainage   Assessment/Plan: This is a 72 y.o. female 12 days s/p robotic assisted laparoscopic cholecystectomy for gallstone pancreatitis with Dr Aleen Campi    - Pain control prn  - Reviewed wound care recommendation  - Reviewed lifting restrictions; 4 weeks total  - Reviewed surgical pathology; CCC  - She can follow up on as needed basis; She understands to call with questions/concerns  -- Lynden Oxford, PA-C [facility] 08/29/2023, 1:53 PM M-F: 7am - 4pm

## 2023-08-31 ENCOUNTER — Encounter: Payer: Medicare HMO | Admitting: Physician Assistant

## 2024-04-23 ENCOUNTER — Other Ambulatory Visit: Payer: Self-pay | Admitting: Internal Medicine

## 2024-04-23 DIAGNOSIS — Z1231 Encounter for screening mammogram for malignant neoplasm of breast: Secondary | ICD-10-CM

## 2024-05-20 ENCOUNTER — Ambulatory Visit
Admission: RE | Admit: 2024-05-20 | Discharge: 2024-05-20 | Disposition: A | Discharge status: Home / Self Care | Source: Ambulatory Visit | Attending: Internal Medicine | Admitting: Internal Medicine

## 2024-05-20 DIAGNOSIS — Z1231 Encounter for screening mammogram for malignant neoplasm of breast: Secondary | ICD-10-CM | POA: Insufficient documentation
